# Patient Record
Sex: Female | Born: 1940 | Race: White | Hispanic: No | Marital: Married | Smoking: Former smoker
Health system: Southern US, Community
[De-identification: ages and names within clinical notes are randomized; demographics above are authoritative.]

## PROBLEM LIST (undated history)

## (undated) DIAGNOSIS — L409 Psoriasis, unspecified: Secondary | ICD-10-CM

## (undated) DIAGNOSIS — K635 Polyp of colon: Secondary | ICD-10-CM

## (undated) DIAGNOSIS — L0390 Cellulitis, unspecified: Secondary | ICD-10-CM

## (undated) DIAGNOSIS — E785 Hyperlipidemia, unspecified: Secondary | ICD-10-CM

## (undated) DIAGNOSIS — H269 Unspecified cataract: Secondary | ICD-10-CM

## (undated) DIAGNOSIS — K2270 Barrett's esophagus without dysplasia: Secondary | ICD-10-CM

## (undated) DIAGNOSIS — E669 Obesity, unspecified: Secondary | ICD-10-CM

## (undated) DIAGNOSIS — F419 Anxiety disorder, unspecified: Secondary | ICD-10-CM

## (undated) DIAGNOSIS — C50919 Malignant neoplasm of unspecified site of unspecified female breast: Secondary | ICD-10-CM

## (undated) DIAGNOSIS — K5790 Diverticulosis of intestine, part unspecified, without perforation or abscess without bleeding: Secondary | ICD-10-CM

## (undated) DIAGNOSIS — J189 Pneumonia, unspecified organism: Secondary | ICD-10-CM

## (undated) DIAGNOSIS — D219 Benign neoplasm of connective and other soft tissue, unspecified: Secondary | ICD-10-CM

## (undated) DIAGNOSIS — E0500 Thyrotoxicosis with diffuse goiter without thyrotoxic crisis or storm: Secondary | ICD-10-CM

## (undated) DIAGNOSIS — M797 Fibromyalgia: Secondary | ICD-10-CM

## (undated) HISTORY — PX: MASTECTOMY: SHX3

## (undated) HISTORY — PX: APPENDECTOMY: SHX54

## (undated) HISTORY — DX: Pneumonia, unspecified organism: J18.9

## (undated) HISTORY — DX: Psoriasis, unspecified: L40.9

## (undated) HISTORY — PX: BREAST SURGERY: SHX581

## (undated) HISTORY — DX: Hyperlipidemia, unspecified: E78.5

## (undated) HISTORY — DX: Thyrotoxicosis with diffuse goiter without thyrotoxic crisis or storm: E05.00

## (undated) HISTORY — DX: Diverticulosis of intestine, part unspecified, without perforation or abscess without bleeding: K57.90

## (undated) HISTORY — DX: Benign neoplasm of connective and other soft tissue, unspecified: D21.9

## (undated) HISTORY — DX: Barrett's esophagus without dysplasia: K22.70

## (undated) HISTORY — DX: Polyp of colon: K63.5

## (undated) HISTORY — DX: Fibromyalgia: M79.7

## (undated) HISTORY — PX: THYROIDECTOMY: SHX17

## (undated) HISTORY — DX: Anxiety disorder, unspecified: F41.9

## (undated) HISTORY — DX: Cellulitis, unspecified: L03.90

## (undated) HISTORY — DX: Malignant neoplasm of unspecified site of unspecified female breast: C50.919

## (undated) HISTORY — DX: Obesity, unspecified: E66.9

---

## 1997-08-19 ENCOUNTER — Ambulatory Visit (HOSPITAL_COMMUNITY): Admission: RE | Admit: 1997-08-19 | Discharge: 1997-08-19 | Payer: Self-pay | Admitting: Obstetrics and Gynecology

## 1998-04-25 ENCOUNTER — Encounter: Payer: Self-pay | Admitting: Endocrinology

## 1998-04-25 ENCOUNTER — Ambulatory Visit (HOSPITAL_COMMUNITY): Admission: RE | Admit: 1998-04-25 | Discharge: 1998-04-25 | Payer: Self-pay | Admitting: Endocrinology

## 1998-05-15 ENCOUNTER — Ambulatory Visit: Admission: RE | Admit: 1998-05-15 | Discharge: 1998-05-15 | Payer: Self-pay | Admitting: Cardiology

## 1998-06-15 ENCOUNTER — Emergency Department (HOSPITAL_COMMUNITY): Admission: EM | Admit: 1998-06-15 | Discharge: 1998-06-15 | Payer: Self-pay | Admitting: Emergency Medicine

## 1998-06-16 ENCOUNTER — Encounter: Payer: Self-pay | Admitting: Emergency Medicine

## 1999-02-26 ENCOUNTER — Other Ambulatory Visit: Admission: RE | Admit: 1999-02-26 | Discharge: 1999-02-26 | Payer: Self-pay | Admitting: Obstetrics and Gynecology

## 1999-07-31 ENCOUNTER — Encounter: Payer: Self-pay | Admitting: Endocrinology

## 1999-07-31 ENCOUNTER — Ambulatory Visit (HOSPITAL_COMMUNITY): Admission: RE | Admit: 1999-07-31 | Discharge: 1999-07-31 | Payer: Self-pay | Admitting: Endocrinology

## 1999-08-20 ENCOUNTER — Ambulatory Visit (HOSPITAL_COMMUNITY): Admission: RE | Admit: 1999-08-20 | Discharge: 1999-08-20 | Payer: Self-pay | Admitting: Endocrinology

## 1999-08-20 ENCOUNTER — Encounter: Payer: Self-pay | Admitting: Endocrinology

## 2000-04-13 ENCOUNTER — Encounter: Admission: RE | Admit: 2000-04-13 | Discharge: 2000-06-03 | Payer: Self-pay | Admitting: Surgery

## 2001-06-18 ENCOUNTER — Other Ambulatory Visit: Admission: RE | Admit: 2001-06-18 | Discharge: 2001-06-18 | Payer: Self-pay | Admitting: Obstetrics and Gynecology

## 2001-07-20 ENCOUNTER — Encounter (INDEPENDENT_AMBULATORY_CARE_PROVIDER_SITE_OTHER): Payer: Self-pay | Admitting: Specialist

## 2001-07-20 ENCOUNTER — Ambulatory Visit (HOSPITAL_COMMUNITY): Admission: RE | Admit: 2001-07-20 | Discharge: 2001-07-20 | Payer: Self-pay | Admitting: *Deleted

## 2002-09-14 ENCOUNTER — Other Ambulatory Visit: Admission: RE | Admit: 2002-09-14 | Discharge: 2002-09-14 | Payer: Self-pay | Admitting: Obstetrics and Gynecology

## 2003-01-08 ENCOUNTER — Emergency Department (HOSPITAL_COMMUNITY): Admission: EM | Admit: 2003-01-08 | Discharge: 2003-01-08 | Payer: Self-pay | Admitting: Emergency Medicine

## 2003-01-08 ENCOUNTER — Encounter: Payer: Self-pay | Admitting: Emergency Medicine

## 2003-03-27 ENCOUNTER — Encounter (HOSPITAL_COMMUNITY): Payer: Self-pay | Admitting: Oncology

## 2003-03-27 ENCOUNTER — Ambulatory Visit (HOSPITAL_COMMUNITY): Admission: RE | Admit: 2003-03-27 | Discharge: 2003-03-27 | Payer: Self-pay | Admitting: Oncology

## 2003-09-18 ENCOUNTER — Other Ambulatory Visit: Admission: RE | Admit: 2003-09-18 | Discharge: 2003-09-18 | Payer: Self-pay | Admitting: Obstetrics and Gynecology

## 2003-11-05 ENCOUNTER — Emergency Department (HOSPITAL_COMMUNITY): Admission: EM | Admit: 2003-11-05 | Discharge: 2003-11-05 | Payer: Self-pay | Admitting: Emergency Medicine

## 2003-11-20 ENCOUNTER — Ambulatory Visit (HOSPITAL_COMMUNITY): Admission: RE | Admit: 2003-11-20 | Discharge: 2003-11-20 | Payer: Self-pay | Admitting: *Deleted

## 2003-11-20 ENCOUNTER — Encounter (INDEPENDENT_AMBULATORY_CARE_PROVIDER_SITE_OTHER): Payer: Self-pay | Admitting: Specialist

## 2004-09-27 ENCOUNTER — Encounter: Admission: RE | Admit: 2004-09-27 | Discharge: 2004-09-27 | Payer: Self-pay | Admitting: Endocrinology

## 2004-09-30 ENCOUNTER — Other Ambulatory Visit: Admission: RE | Admit: 2004-09-30 | Discharge: 2004-09-30 | Payer: Self-pay | Admitting: Obstetrics and Gynecology

## 2005-03-24 ENCOUNTER — Ambulatory Visit: Payer: Self-pay | Admitting: Oncology

## 2006-03-20 ENCOUNTER — Ambulatory Visit: Payer: Self-pay | Admitting: Oncology

## 2006-03-24 LAB — COMPREHENSIVE METABOLIC PANEL
Albumin: 4.1 g/dL (ref 3.5–5.2)
Alkaline Phosphatase: 57 U/L (ref 39–117)
Calcium: 9.4 mg/dL (ref 8.4–10.5)
Chloride: 102 mEq/L (ref 96–112)
Potassium: 4.2 mEq/L (ref 3.5–5.3)
Sodium: 137 mEq/L (ref 135–145)
Total Bilirubin: 0.5 mg/dL (ref 0.3–1.2)

## 2006-03-24 LAB — CBC WITH DIFFERENTIAL/PLATELET
Eosinophils Absolute: 0.1 10*3/uL (ref 0.0–0.5)
HCT: 38.5 % (ref 34.8–46.6)
HGB: 13.3 g/dL (ref 11.6–15.9)
LYMPH%: 24.8 % (ref 14.0–48.0)
MCH: 31.1 pg (ref 26.0–34.0)
MCV: 89.9 fL (ref 81.0–101.0)
MONO#: 0.5 10*3/uL (ref 0.1–0.9)
MONO%: 6.7 % (ref 0.0–13.0)
NEUT%: 66.7 % (ref 39.6–76.8)
Platelets: 227 10*3/uL (ref 145–400)

## 2006-03-24 LAB — LACTATE DEHYDROGENASE: LDH: 147 U/L (ref 94–250)

## 2007-03-19 ENCOUNTER — Ambulatory Visit: Payer: Self-pay | Admitting: Oncology

## 2007-06-10 HISTORY — PX: ROTATOR CUFF REPAIR: SHX139

## 2008-05-28 ENCOUNTER — Inpatient Hospital Stay (HOSPITAL_COMMUNITY): Admission: EM | Admit: 2008-05-28 | Discharge: 2008-05-31 | Payer: Self-pay | Admitting: Family Medicine

## 2008-05-30 ENCOUNTER — Ambulatory Visit: Payer: Self-pay | Admitting: Vascular Surgery

## 2008-05-30 ENCOUNTER — Encounter (INDEPENDENT_AMBULATORY_CARE_PROVIDER_SITE_OTHER): Payer: Self-pay | Admitting: Internal Medicine

## 2008-09-07 ENCOUNTER — Ambulatory Visit (HOSPITAL_BASED_OUTPATIENT_CLINIC_OR_DEPARTMENT_OTHER): Admission: RE | Admit: 2008-09-07 | Discharge: 2008-09-08 | Payer: Self-pay | Admitting: Orthopedic Surgery

## 2009-12-24 ENCOUNTER — Encounter: Admission: RE | Admit: 2009-12-24 | Discharge: 2009-12-24 | Payer: Self-pay | Admitting: Endocrinology

## 2010-09-06 ENCOUNTER — Encounter (INDEPENDENT_AMBULATORY_CARE_PROVIDER_SITE_OTHER): Payer: Self-pay | Admitting: *Deleted

## 2010-09-18 LAB — POCT HEMOGLOBIN-HEMACUE: Hemoglobin: 13.6 g/dL (ref 12.0–15.0)

## 2010-09-19 LAB — BASIC METABOLIC PANEL
Calcium: 9.2 mg/dL (ref 8.4–10.5)
Creatinine, Ser: 1.03 mg/dL (ref 0.4–1.2)
GFR calc non Af Amer: 54 mL/min — ABNORMAL LOW (ref >60)
Glucose, Bld: 109 mg/dL — ABNORMAL HIGH (ref 70–99)
Potassium: 4.8 mEq/L (ref 3.5–5.1)
Sodium: 139 mEq/L (ref 135–145)

## 2010-10-15 ENCOUNTER — Ambulatory Visit: Payer: Self-pay | Admitting: Gastroenterology

## 2010-10-22 NOTE — H&P (Signed)
NAME:  Kathy Cooper, Kathy Cooper NO.:  [id number]   MEDICAL RECORD NO.:  [id number]          PATIENT TYPE:  EMS   LOCATION:  MAJO                         FACILITY:  [facility]   PHYSICIAN:  Eduard Clos, MDDATE OF BIRTH:  1941/03/25   DATE OF ADMISSION:  05/28/2008  DATE OF DISCHARGE:                              HISTORY & PHYSICAL   PRIMARY CARE PHYSICIAN:  Brooke Bonito, M.D.   CHIEF COMPLAINT:  Right upper extremity erythema and pain.   HISTORY OF PRESENT ILLNESS:  A 70 year old female with history of right-  sided CA of breast treated with radical mastectomy, history of  hyperlipidemia, depression, Grave's disease, status post radiation with  Synthroid now.  The patient has been complaining of increasing erythema  and pain in the right upper extremity.  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  Started developing some erythema in the morning  around her right wrist which gradually worsened over the evening.  She  did call her family physician who called in some antibiotics, but the  patient came to the ER as things got worse and the patient was found to  have erythema extending from her wrist to her shoulder area of the right  upper extremity and is being admitted for further management of her  cellulitis.  The patient denies any fever but did have some chills-like  feeling.  She denies any chest pain, shortness of breath, nausea,  vomiting, diarrhea, loss of function or weakness of limbs, headache,  dysuria,  or discharge .   PAST MEDICAL HISTORY:  1. History of carcinoma of the breast, status post radical mastectomy      on the right side.  2. Hyperlipidemia.  3. Hypothyroidism, status post Grave's disease with.  4. History of depression.   PAST SURGICAL HISTORY:  Right-sided radical mastectomy for carcinoma of  the breast.   MEDICATIONS PRIOR TO ADMISSION:  1,  The patient  is on phentermine 37.5  mg p.o. daily.  1. Zolpidem 5 mg p.o. at bedtime.  2. Omeprazole 20 mg p.o. daily.  3. Crestor 10 mg daily.  4. Citalopram 20 mg daily.  5. Synthroid 88 mcg p.o. daily.  6. Ventolin two puffs q.6 h. p.r.n.   ALLERGIES:  No known drug allergies.   FAMILY HISTORY:  Noncontributory.   SOCIAL HISTORY:  The patient lives with her husband.  Denies smoking  cigarettes or drinking alcohol  or using illegal drugs.   REVIEW OF SYSTEMS:  As per history of presenting illness, nothing else  significant.   PHYSICAL EXAMINATION:  GENERAL:  The patient was examined at the  bedside.  Not in acute distress.  VITAL SIGNS:  Blood pressure 148/55, pulse 80 per minute, temperature  98.3, respiration 18 per minute, oxygen saturation 97%.  HEENT:  Anicteric.  No pallor.  LUNGS:  Bilateral air entry.  No rhonchi on auscultation.  HEART:  S1 and S2 heard.  ABDOMEN:  Soft, nontender.  Bowel sounds heard.  No guarding, no  rigidity.  CNS:  Alert, awake, oriented to time, place and person.  Motor strength  in the lower extremities 5/5.  EXTREMITIES:  There is and mild induration extending from the right hand  to her right shoulder.  There is no compromise in her joint motion and  there is no joint swelling.  The rest of the extremities, pulses felt  and no edema.  Pulses in the right upper extremity are bounding.   LABORATORY DATA:  WBC 13.1, hemoglobin 13.8, hematocrit 41.2, platelets  236,000, neutrophils 84%.  Basic metabolic panel showed sodium 136,  potassium 3.9, chloride 103, CO2 26, glucose 110, BUN 7, creatinine 0.8,  calcium 9.1.   ASSESSMENT:  1. Cellulitis of the right upper extremity.  2. History of carcinoma of the breast, status post right radical      mastectomy.  3. Hyperlipidemia.  4. Hypothyroidism.  5. History of depression with no suicidal ideation now.   PLAN:  Admit the patient to the medical floor.  Will start the patient  on vancomycin and Zosyn,  keep right arm elevated.  Will get cultures and  follow them.  Continue home medications.  The patient at this time  wanted to continue her phentermine, hold off that.  Further  recommendations as conditions revolves.      Eduard Clos, MD  Electronically Signed     ANK/MEDQ  D:  05/28/2008  T:  05/29/2008  Job:  [id number]

## 2010-10-22 NOTE — Op Note (Signed)
NAME:  Kathy Cooper, Kathy Cooper NO.:  [id number]   MEDICAL RECORD NO.:  [id number]          PATIENT TYPE:  AMB   LOCATION:  DSC                          FACILITY:  [facility]   PHYSICIAN:  Katy Fitch. Sypher, M.D. DATE OF BIRTH:  Jan 11, 1941   DATE OF PROCEDURE:  09/07/2008  DATE OF DISCHARGE:                               OPERATIVE REPORT   PREOPERATIVE DIAGNOSES:  1. 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  2. 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  3. Biceps tendinopathy.   POSTOPERATIVE DIAGNOSES:  1. 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  2. 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  3. Biceps tendinopathy.  4. Labral degenerative tear.   OPERATIONS:  1. Diagnostic arthroscopy, right glenohumeral joint identifying a 25%      biceps degenerative tear.  Also, a 90% deep surface partial-      thickness articular-surface tendon avulsion degenerative tear of      the supraspinatus, infraspinatus, and rotator cuff tendons.  2. Arthroscopic subacromial decompression including acromioplasty,      partial relaxation of coracoacromial ligament.  3. Arthroscopic distal clavicle resection.  4. Open repair of a chronic supraspinatus, infraspinatus rotator cuff      tear utilizing double row technique with a PEEK corkscrews medially      and swivel locks laterally.   OPERATING SURGEON:  Katy Fitch. Sypher, MD   ASSISTANT:  Marveen Reeks Dasnoit, PA-C   ANESTHESIA:  General endotracheal supplemented by right interscalene  block.   SUPERVISING ANESTHESIOLOGIST:  Quita Skye. Krista Blue, MD   INDICATIONS:  Kathy Cooper is a well-known 70 year old woman referred  through the courtesy of Dr. Adela Lank.  She has a history of chronic  right shoulder pain.  Clinical examination revealed evidence of a  probable rotator cuff tear.  Plain films  documented AC degenerative  arthritis.  Due to her pain, weakness of abduction, and positive  impingement signs, an MRI was obtained which confirmed a degenerative  tear of the supraspinatus, infraspinatus rotator cuff tendons as well as  very unfavorable AC anatomy.  She had abnormal signal in biceps and  labrum.   We recommended diagnostic arthroscopy and anticipated open repair of the  rotator cuff due to her history of mastectomy and lymph node resection  and issues with chronic right arm lymphedema.  Our plan was to quickly  perform arthroscopic evaluation of the shoulder followed by arthroscopic  subacromial decompression and distal clavicle resection but avoid the  possible clysis that would occur with prolonged use of an arthroscopic  pump for repair.   Preoperatively, questions were invited and answered in detail.  She was  advised of the potential risks and benefits of this procedure.   PROCEDURE IN DETAIL:  Darlyn Chamber was brought to the operating room  and placed in supine position on the operating table.   Following an anesthesia consult with Dr. Krista Blue, a right interscalene  block was placed in the holding area without complication.  She was brought to room 1 of the [facility], placed in supine  position on the operating table and under Dr. Robina Ade direct  supervision, general anesthesia by endotracheal technique induced.   Ms. Herald was carefully positioned in a beach-chair position with aid  of a torso and head holder designed for shoulder arthroscopy.  Passive  leg compression pumps were applied for deep vein thrombosis prevention.   The entire right extremity and forequarter prepped with DuraPrep and  draped with impervious arthroscopy drapes.   Procedure commenced with placement of the arthroscope through a standard  posterior viewing portal with a switching stick brought in anteriorly.  Diagnostic arthroscopy confirmed a degenerative labrum.   There was no  sign of significant arthritis of the hyaline articular cartilage  surfaces of the glenoid and humeral head.  The biceps had a 25%  degenerative tear just proximal to the intertubercular groove.  The deep  surface of the rotator cuff was very degenerative with a PASTA lesion  measuring at least 90% the thickness of the insertion of the  supraspinatus and about 75% the thickness of the infraspinatus  insertion.   The biceps, labrum, and deep surface of the rotator cuff were debrided  with a 4.5-mm suction shaver brought into the anterior portal under  direct vision.  After debridement was completed, hemostasis was achieved  followed by removal of the scope from glenohumeral joint and placement  of the scope in the subacromial space.  There was a florid bursitis  noted.  A meticulous bursectomy allowed examination of the anatomy of  the coracoacromial arch.  The capsule of the [facility] joint was taken down  with the cutting cautery and a very degenerative distal clavicle with an  inferior projecting osteophyte was documented with a digital camera.  The acromion was leveled to a type 1 morphology and the coracoacromial  ligament partially released to allow leveling of the acromion medially.   The distal clavicle was resected 15 mm with a suction bur followed by  hemostasis.  After debridement of the bursa, we proceeded directly to  open repair of the cuff to a muscle-splitting incision between the  anterior middle thirds of the deltoid.  Due to Ms. Ditullio mesomorphic  status, a 5-cm incision was required to expose the deltoid.  The deltoid  was split and the tear identified.  The greater tuberosity was lowered  approximately 3 mm in profile with a bur followed by placement of two  medial PEEK corkscrew anchors with a FiberWire.  A total of four medial  mattress sutures were placed followed by use of two lateral push locks  for an over-the-top double row technique.   Anatomic  footprint was achieved with a very low profile of the  tuberosity created after tuberosity plasty.   The wound was then lavaged with sterile saline followed by repair of the  deltoid with simple suture of 0-Vicryl and a proximal apical corner  suture of #2 FiberWire.   The skin incision was repaired with subcutaneous suture of 0 and 2-0  Vicryl followed by intradermal 3-0 Prolene with Steri-Strips.   Due to the fact that Ms. Pouliot had a history of a MRSA infection in  the early winter, she was provided an additional gram of vancomycin over  90 minutes as a prophylactic antibiotic.   She will be admitted to the recovery care center for observation of  vital signs, appropriate pain management in the form of p.o. and IV  Dilaudid,  and possible use of PCA morphine.   She will be discharged in the morning of September 07, 2008 with  prescriptions for Dilaudid 2 mg 1-2 tablets p.o. q.4-6 hours p.r.n.  pain, Motrin 600 mg 1 p.o. q.6 h. p.r.n. pain, and Keflex 500 mg 1 p.o.  q.8 h. x4 days.      Katy Fitch Sypher, M.D.  Electronically Signed     RVS/MEDQ  D:  09/07/2008  T:  09/08/2008  Job:  [id number]

## 2010-10-22 NOTE — Discharge Summary (Signed)
NAME:  Kathy Cooper, Kathy Cooper NO.:  [id number]   MEDICAL RECORD NO.:  [id number]          PATIENT TYPE:  INP   LOCATION:  5158                         FACILITY:  [facility]   PHYSICIAN:  Beckey Rutter, MD  DATE OF BIRTH:  [birth year]   DATE OF ADMISSION:  05/28/2008  DATE OF DISCHARGE:  05/31/2008                               DISCHARGE SUMMARY   PRIMARY CARE PHYSICIAN:  Brooke Bonito, MD   CHIEF COMPLAINT:  Right upper extremity erythema and pain.   HISTORY OF PRESENT ILLNESS:  A 70 year old admitted with above complaint  for intravenous antibiotic.  Please refer to the full H&P dictated on  the day of admission by Dr. Midge Minium.   HOSPITAL COURSE:  During hospital course, the patient was treated with  vancomycin and Zosyn.  The patient has very good response to the  antibiotic.  Today, the patient's erythema is totally cleared.  The  swelling is very much decreased to the premorbid baseline.  The patient  is stable for discharge today.   DISCHARGE MEDICATIONS:  1. Keflex 500 mg p.o. b.i.d. for seven more days.  2. Ambien 5 mg p.o. at bedtime.  3. Omeprazole 20 mg p.o. daily.  4. Crestor 10 mg daily.  5. Citalopram 20 mg daily.  6. Synthroid 88 mcg daily.  7. Ventolin 2 puffs q.6 h. p.r.n.   DISCHARGE DIAGNOSES:  1. Right upper arm cellulitis.  2. Right upper arm lymphedema, status post radical mastectomy in the      right side.  3. History of carcinoma of the breast, status post radical mastectomy.  4. Hyperlipidemia.  5. Hypothyroidism.  6. History of depression.   LABORATORY DATA:  White blood count on the day of admission is 13.1.  The last white blood count was done yesterday, May 30, 2008, and  white count is 4.2.  Hemoglobin is 12.6 and platelet is 178.  Blood  culture is negative up-to-date.  Hemoglobin A1c is 5.6.  TSH is 0.613.  Sodium 136, potassium 3.9, chloride 103, bicarb 26, glucose 110, BUN 7,  and creatinine 0.87.   DISCHARGE/PLAN:  The patient is discharged today on antibiotic.  The  patient received vancomycin, I suspect to cover for MRSA.  Nevertheless,  the patient improved very quickly in the span of 48 hours.  The patient  also has recurrent history of cellulitis.  She is treated with  penicillin with success all the time.  The patient never diagnosed with  methicillin-resistant Staph aureus before.  With all those, I would  conclude the patient did not have MRSA and  I will continue with antibiotic.  I discussed the condition thoroughly  with the patient.  I answered all her questions.  I also instructed the  patient to come to the Emergency Department immediately should the  redness of the arm returns.  She is aware and agreeable to discharge  plan.      Beckey Rutter, MD  Electronically Signed     EME/MEDQ  D:  05/31/2008  T:  05/31/2008  Job:  [id number]   cc:  Brooke Bonito, M.D.

## 2010-10-25 NOTE — Op Note (Signed)
NAME:  TAYLR, MEUTH NO.:  [id number]   MEDICAL RECORD NO.:  [id number]                   PATIENT TYPE:  AMB   LOCATION:  ENDO                                 FACILITY:  [facility]   PHYSICIAN:  Georgiana Spinner, M.D.                 DATE OF BIRTH:  [birth year]   DATE OF PROCEDURE:  DATE OF DISCHARGE:                                 OPERATIVE REPORT   PROCEDURE:  Upper endoscopy with biopsy.   INDICATIONS FOR PROCEDURE:  Gastroesophageal reflux disease with Barrett's.   ANESTHESIA:  Demerol 50, Versed 6.   DESCRIPTION OF PROCEDURE:  With the patient mildly sedated in the left  lateral decubitus position, the Olympus videoscopic endoscope was inserted  in the mouth and passed under direct vision through the esophagus which  appeared normal until we reached the distal esophagus and there was a  question of Barrett's photographed and biopsied. We entered into the  stomach, fundus, body, antrum, duodenal bulb and second portion of the  duodenum and all appeared normal. From this point, the endoscope was slowly  withdrawn taking circumferential views of the duodenal mucosa until the  endoscope was then pulled back in the stomach, placed in retroflexion to  view the stomach from below. The endoscope was then straightened and  withdrawn taking circumferential views of the remaining gastric and  esophageal mucosa. The patient's vital signs and pulse oximeter remained  stable. The patient tolerated the procedure well without apparent  complications.   FINDINGS:  Barrett's esophagus biopsied, await biopsy report. The patient  will call me for results and followup with me as an outpatient.                                               Georgiana Spinner, M.D.    GMO/MEDQ  D:  11/20/2003  T:  11/20/2003  Job:  [id number]

## 2010-10-25 NOTE — Op Note (Signed)
[place]  Patient:    Kathy Cooper, Kathy Cooper Visit Number: [id number] MRN: [id number]          Service Type: END Location: ENDO Attending Physician:  Sabino Gasser Dictated by:   Sabino Gasser, M.D. Proc. Date: 07/20/01 Admit Date:  07/20/2001   CC:         Trevor Iha, M.D.   Operative Report  PROCEDURE:  Upper endoscopy.  ANESTHESIA:  Demerol 90 and Versed 9 mg.  INDICATIONS:  Hemoccult positivity with reflux symptomatology.  DESCRIPTION OF PROCEDURE:  With the patient mildly sedated in the left lateral decubitus position, the Olympus videoscopic endoscope was inserted into the mouth, passed under direct vision through the esophagus.  The distal esophagus was approached and showed a hiatal hernia with changes of esophagitis and/or Barretts esophagus seen and photographed.  We entered into the stomach through the hiatal hernia.  Fundus, body, antrum, duodenal bulb, second portion of the duodenum were well visualized.  Photographs were taken from this point.  The endoscope was slowly withdrawn taking circumferential views of the entire duodenal mucosa.  The endoscope was then pulled back into the stomach and placed in retroflexion to view the stomach from below.  The endoscope was then straightened and withdrawn taking circumferential views of the remaining gastric and esophageal mucosa stopping in the body and antrum of the stomach where diffuse erythema with some flecks of blood were seen in the stomach.  These were photographed and multiple biopsies were taken.  The endoscope was withdrawn to the esophagus.  Distal esophagus showed changes of esophagus and/or Barretts esophagus, photographed and biopsied.  The endoscope was then withdrawn taking circumferential views of the remaining esophageal mucosa.  The patients vital signs and pulse oximetry remained stable. The patient tolerated the procedure well without  apparent complications.  FINDINGS:  Changes of gastritis in antrum and body of stomach.  Await biopsy report.  Changes of esophagitis and/or Barretts esophagus.  Again, await biopsy report.  The patient will call me for results and follow up with me as an outpatient.  Proceed to colonoscopy as planned. Dictated by:   Sabino Gasser, M.D. Attending Physician:  Sabino Gasser DD:  07/20/01 TD:  07/20/01 Job: [id number]

## 2010-10-25 NOTE — Op Note (Signed)
[place]  Patient:    Kathy Cooper, Kathy Cooper Visit Number: [id number] MRN: [id number]          Service Type: END Location: ENDO Attending Physician:  Sabino Gasser Dictated by:   Sabino Gasser, M.D. Proc. Date: 07/20/01 Admit Date:  07/20/2001   CC:         Trevor Iha, M.D.   Operative Report  PROCEDURE:  Colonoscopy.  INDICATIONS:  Hemoccult positivity.  ANESTHESIA:  Demerol 30, Versed 3 mg additionally.  DESCRIPTION OF PROCEDURE:  With the patient mildly sedated in the left lateral decubitus position, the Olympus videoscopic colonoscope was inserted in the rectum after normal rectal examination and passed under direct vision to the cecum, identified by the ileocecal valve and appendiceal orifice, both of which were photographed.  From this point, the colonoscope was slowly withdrawn taking circumferential views of the entire colonic mucosa stopping only in the rectum which appeared normal and this showed hemorrhoids on retroflexed view.  The endoscope was straightened and withdrawn.  The patients vital signs and pulse oximetry remained stable.  The patient tolerated the procedure well without apparent complications.  FINDINGS:  Internal hemorrhoids, otherwise unremarkable examination.  PLAN:  See endoscopy note for further details. Dictated by:   Sabino Gasser, M.D. Attending Physician:  Sabino Gasser DD:  07/20/01 TD:  07/20/01 Job: [id number]

## 2010-11-26 ENCOUNTER — Encounter: Payer: Self-pay | Admitting: Gastroenterology

## 2010-11-26 ENCOUNTER — Ambulatory Visit (INDEPENDENT_AMBULATORY_CARE_PROVIDER_SITE_OTHER): Payer: Medicare Other | Admitting: Gastroenterology

## 2010-11-26 DIAGNOSIS — K2270 Barrett's esophagus without dysplasia: Secondary | ICD-10-CM

## 2010-11-26 DIAGNOSIS — Z1211 Encounter for screening for malignant neoplasm of colon: Secondary | ICD-10-CM

## 2010-11-26 DIAGNOSIS — R1310 Dysphagia, unspecified: Secondary | ICD-10-CM

## 2010-11-26 DIAGNOSIS — K219 Gastro-esophageal reflux disease without esophagitis: Secondary | ICD-10-CM

## 2010-11-26 MED ORDER — PEG-KCL-NACL-NASULF-NA ASC-C 100 G PO SOLR: 1.0000 | ORAL | Status: DC

## 2010-11-26 MED ORDER — ESOMEPRAZOLE MAGNESIUM 40 MG PO PACK: 40.0000 mg | PACK | Freq: Every day | ORAL | Status: DC

## 2010-11-26 NOTE — Progress Notes (Signed)
HPI: This is a  Very pleasant 70 yo woman  She has been having a lot of acid reflux, burning up into her mouth even. Shes had epigastric pains, burning. Stomach aches.  Intermittent loose stools.  Lots of indigestion and flatus as well.  These have all been going on for many years.  Certain foods can exacerbate the symptoms (key lime pie recently).  The symptoms seem to improve with avoiding "acidy foods,"  She takes omeprazole once daily, sometimes twice a day.  She has intermittent dysphagia.  She has coughing/congestion (PCP tells her it may be from GERD).    She was told she had Barrett's esophagus (had EGD in 2005, but none since then)  She had a polyp removed in 2005 (does not recall being told she needed a repeat anytime.  I reviewed records from Dr. Virginia Rochester she had a colonoscopy in 2003, no polyps were found.  EGD in 2002 and also in 2005 both are found in intestinal metaplasia without dysplasia. She has not had repeat EGD since then.   Overa stable weight. No overt GI bleeding   Re and , view of systems: Pertinent positive and negative review of systems were noted in the above HPI section.  All other review of systems was otherwise negative.   Past Medical History  Diagnosis Date  . Anxiety   . Breast cancer   . Psoriasis   . Fibroids   . Asthma   . Barrett's esophagus   . Colon polyps   . Diverticulosis   . Fibromyalgia   . Hyperlipemia   . Grave's disease   . Obesity   . Pneumonia     Past Surgical History  Procedure Date  . Thyroidectomy   . Mastectomy   . Appendectomy      reports that she has quit smoking. She has never used smokeless tobacco. She reports that she does not drink alcohol or use illicit drugs.  family history includes Breast cancer in an unspecified family member; Diabetes in an unspecified family member; Heart disease in an unspecified family member; and Kidney disease in her brother.    Current Medications, Allergies were all reviewed with  the patient via [facility] electronic medical record system.    Physical Exam: BP 118/82  Pulse 80  Ht 5\' 1"  (1.549 m)  Wt 199 lb (90.266 kg)  BMI 37.60 kg/m2 Constitutional: generally well-appearing Psychiatric: alert and oriented x3 Eyes: extraocular movements intact Mouth: oral pharynx moist, no lesions Neck: supple no lymphadenopathy Cardiovascular: heart regular rate and rhythm Lungs: clear to auscultation bilaterally Abdomen: soft, nontender, nondistended, no obvious ascites, no peritoneal signs, normal bowel sounds Extremities: no lower extremity edema bilaterally Skin: no lesions on visible extremities    Assessment and plan: 70 y.o. female with personal history of barretts, routine risk for colon cancer  EGd and colonoscopy.  Samples of nexium given, GERD handout given.

## 2010-11-26 NOTE — Patient Instructions (Addendum)
  You will be set up for an upper endoscopy for dysphagia, GERD. You will be set up for a colonoscopy for routine screening. Samples of nexium given, use this once daily 20-30 min before breakfast. GERD handout given.  Acid Reflux (GERD) Acid reflux is also called gastroesophageal reflux disease (GERD). Your stomach makes acid to help digest food. Acid reflux happens when acid from your stomach goes into the tube between your mouth and stomach (esophagus). Your stomach is protected from the acid, but this tube is not. When acid gets into the tube, it may cause a burning feeling in the chest (heartburn). Besides heartburn, other health problems can happen if the acid keeps going into the tube. Some causes of acid reflux include:  Being overweight.   Smoking.   Drinking alcohol.   Eating large meals.   Eating meals and then going to bed right away.   Eating certain foods.   Increased stomach acid production.  HOME CARE  Take all medicine as told by your doctor.   You may need to:   Lose weight.   Avoid alcohol.   Quit smoking.   Do not eat big meals. It is better to eat smaller meals throughout the day.   Do not eat a meal and then nap or go to bed.   Sleep with your head higher than your stomach.   Avoid foods that bother you.   You may need more tests, or you may need to see a special doctor.  GET HELP RIGHT AWAY IF:  You have chest pain that is different than before.   You have pain that goes to your arms, jaw, or between your shoulder blades.   You throw up (vomit) blood, dark brown liquid, or your throw up looks like coffee grounds.   You have trouble swallowing.   You have trouble breathing or cannot stop coughing.   You feel dizzy or pass out.   Your skin is cool, wet, and pale.   Your medicine is not helping.  MAKE SURE YOU:   Understand these instructions.   Will watch your condition.   Will get help right away if you are not doing well or get  worse.  Document Released: 11/12/2007 Document Re-Released: 08/20/2009 [facility] Patient Information 2011 [place], [place].

## 2010-12-02 NOTE — Letter (Signed)
Summary: New Patient letter  [facility]  [address]   [place], [place] [address]   Phone: [phone]  Fax: [phone]       09/06/2010 MRN: [id number]  Kathy Cooper [address] [place], [place]  [address]  [place]  Dear Ms. [facility],  Welcome to the Gastroenterology Division at [facility].    You are scheduled to see Dr.  Christella Hartigan on 10/15/2010 at 3:30 on the 3rd floor at [facility], [address]  We ask that you try to arrive at our office 15 minutes prior to your appointment time to allow for check-in.  We would like you to complete the enclosed self-administered evaluation form prior to your visit and bring it with you on the day of your appointment.  We will review it with you.  Also, please bring a complete list of all your medications or, if you prefer, bring the medication bottles and we will list them.  Please bring your insurance card so that we may make a copy of it.  If your insurance requires a referral to see a specialist, please bring your referral form from your primary care physician.  Co-payments are due at the time of your visit and may be paid by cash, check or credit card.     Your office visit will consist of a consult with your physician (includes a physical exam), any laboratory testing he/she may order, scheduling of any necessary diagnostic testing (e.g. x-ray, ultrasound, CT-scan), and scheduling of a procedure (e.g. Endoscopy, Colonoscopy) if required.  Please allow enough time on your schedule to allow for any/all of these possibilities.    If you cannot keep your appointment, please call [phone] to cancel or reschedule prior to your appointment date.  This allows [place] the opportunity to schedule an appointment for another patient in need of care.  If you do not cancel or reschedule by 5 p.m. the business day prior to your appointment date, you will be charged a $50.00 late cancellation/no-show fee.    Thank you for  choosing [facility] for your medical needs.  We appreciate the opportunity to care for you.  Please visit [place] at our website  to learn more about our practice.                     Sincerely,                                                             The Gastroenterology Division

## 2010-12-04 ENCOUNTER — Encounter: Payer: Self-pay | Admitting: Gastroenterology

## 2010-12-04 ENCOUNTER — Ambulatory Visit (AMBULATORY_SURGERY_CENTER): Payer: Medicare Other | Admitting: Gastroenterology

## 2010-12-04 DIAGNOSIS — Z1211 Encounter for screening for malignant neoplasm of colon: Secondary | ICD-10-CM

## 2010-12-04 DIAGNOSIS — K2990 Gastroduodenitis, unspecified, without bleeding: Secondary | ICD-10-CM

## 2010-12-04 DIAGNOSIS — K2270 Barrett's esophagus without dysplasia: Secondary | ICD-10-CM

## 2010-12-04 DIAGNOSIS — K2970 Gastritis, unspecified, without bleeding: Secondary | ICD-10-CM

## 2010-12-04 DIAGNOSIS — D126 Benign neoplasm of colon, unspecified: Secondary | ICD-10-CM

## 2010-12-04 MED ORDER — SODIUM CHLORIDE 0.9 % IV SOLN: 500.0000 mL | INTRAVENOUS | Status: DC

## 2010-12-04 NOTE — Patient Instructions (Signed)
Mild gastritis Barrett's esophagus  2 small polyps removed  Await pathology for final recommendations  See green and blue sheets for additional d/c instructions.

## 2010-12-05 ENCOUNTER — Telehealth: Payer: Self-pay | Admitting: *Deleted

## 2010-12-05 NOTE — Telephone Encounter (Signed)
[id number] # gicven by pt in admitting day of proc. not in service.

## 2010-12-18 ENCOUNTER — Telehealth: Payer: Self-pay | Admitting: Gastroenterology

## 2010-12-18 NOTE — Telephone Encounter (Signed)
PT WAS READ THE LETTER THAT WAS SENT OUT AND ALSO THE REPORT AND THE PATH WAS SENT TO DR KOHUT PER THE PT REQUEST.

## 2011-03-14 ENCOUNTER — Emergency Department (HOSPITAL_BASED_OUTPATIENT_CLINIC_OR_DEPARTMENT_OTHER)
Admission: EM | Admit: 2011-03-14 | Discharge: 2011-03-14 | Disposition: A | Discharge status: Other Acute Inpatient Hospital | Payer: Medicare Other | Source: Home / Self Care | Attending: Emergency Medicine | Admitting: Emergency Medicine

## 2011-03-14 ENCOUNTER — Encounter (HOSPITAL_BASED_OUTPATIENT_CLINIC_OR_DEPARTMENT_OTHER): Payer: Self-pay | Admitting: *Deleted

## 2011-03-14 ENCOUNTER — Inpatient Hospital Stay (HOSPITAL_COMMUNITY)
Admission: AD | Admit: 2011-03-14 | Discharge: 2011-03-16 | DRG: 603 | Disposition: A | Discharge status: Home / Self Care | Payer: Medicare Other | Source: Other Acute Inpatient Hospital | Attending: Internal Medicine | Admitting: Internal Medicine

## 2011-03-14 DIAGNOSIS — M7989 Other specified soft tissue disorders: Secondary | ICD-10-CM

## 2011-03-14 DIAGNOSIS — J45909 Unspecified asthma, uncomplicated: Secondary | ICD-10-CM | POA: Insufficient documentation

## 2011-03-14 DIAGNOSIS — E669 Obesity, unspecified: Secondary | ICD-10-CM | POA: Insufficient documentation

## 2011-03-14 DIAGNOSIS — IMO0002 Reserved for concepts with insufficient information to code with codable children: Secondary | ICD-10-CM | POA: Insufficient documentation

## 2011-03-14 DIAGNOSIS — F411 Generalized anxiety disorder: Secondary | ICD-10-CM | POA: Diagnosis present

## 2011-03-14 DIAGNOSIS — Z79899 Other long term (current) drug therapy: Secondary | ICD-10-CM | POA: Insufficient documentation

## 2011-03-14 DIAGNOSIS — Z853 Personal history of malignant neoplasm of breast: Secondary | ICD-10-CM | POA: Insufficient documentation

## 2011-03-14 DIAGNOSIS — IMO0001 Reserved for inherently not codable concepts without codable children: Secondary | ICD-10-CM | POA: Insufficient documentation

## 2011-03-14 DIAGNOSIS — L0390 Cellulitis, unspecified: Secondary | ICD-10-CM

## 2011-03-14 DIAGNOSIS — J3489 Other specified disorders of nose and nasal sinuses: Secondary | ICD-10-CM | POA: Diagnosis present

## 2011-03-14 DIAGNOSIS — F329 Major depressive disorder, single episode, unspecified: Secondary | ICD-10-CM | POA: Diagnosis present

## 2011-03-14 DIAGNOSIS — F3289 Other specified depressive episodes: Secondary | ICD-10-CM | POA: Diagnosis present

## 2011-03-14 DIAGNOSIS — E785 Hyperlipidemia, unspecified: Secondary | ICD-10-CM | POA: Insufficient documentation

## 2011-03-14 DIAGNOSIS — E039 Hypothyroidism, unspecified: Secondary | ICD-10-CM | POA: Diagnosis present

## 2011-03-14 LAB — CBC
HCT: 40.9 % (ref 36.0–46.0)
HCT: 37.4 % (ref 36.0–46.0)
HCT: 41.2 % (ref 36.0–46.0)
Hemoglobin: 14 g/dL (ref 12.0–15.0)
Platelets: 201 10*3/uL (ref 150–400)
Platelets: 178 10*3/uL (ref 150–400)
Platelets: 236 10*3/uL (ref 150–400)
RBC: 4.17 MIL/uL (ref 3.87–5.11)
RDW: 14.1 % (ref 11.5–15.5)
WBC: 12.2 10*3/uL — ABNORMAL HIGH (ref 4.0–10.5)
WBC: 13.1 10*3/uL — ABNORMAL HIGH (ref 4.0–10.5)
WBC: 4.2 10*3/uL (ref 4.0–10.5)

## 2011-03-14 LAB — DIFFERENTIAL
Basophils Absolute: 0.1 10*3/uL (ref 0.0–0.1)
Basophils Relative: 0 % (ref 0–1)
Eosinophils Relative: 1 % (ref 0–5)
Eosinophils Relative: 1 % (ref 0–5)
Lymphocytes Relative: 12 % (ref 12–46)
Lymphs Abs: 1.6 10*3/uL (ref 0.7–4.0)
Monocytes Absolute: 0.6 10*3/uL (ref 0.1–1.0)
Neutrophils Relative %: 84 % — ABNORMAL HIGH (ref 43–77)

## 2011-03-14 LAB — BASIC METABOLIC PANEL
BUN: 7 mg/dL (ref 6–23)
CO2: 22 mEq/L (ref 19–32)
Calcium: 9.1 mg/dL (ref 8.4–10.5)
Creatinine, Ser: 1 mg/dL (ref 0.50–1.10)
Creatinine, Ser: 0.87 mg/dL (ref 0.4–1.2)
GFR calc non Af Amer: 57 mL/min — ABNORMAL LOW (ref >90)
GFR calc non Af Amer: >60 mL/min (ref >60)
Glucose, Bld: 118 mg/dL — ABNORMAL HIGH (ref 70–99)
Glucose, Bld: 110 mg/dL — ABNORMAL HIGH (ref 70–99)
Potassium: 4.1 mEq/L (ref 3.5–5.1)
Sodium: 137 mEq/L (ref 135–145)

## 2011-03-14 LAB — CULTURE, BLOOD (ROUTINE X 2)

## 2011-03-14 LAB — HEMOGLOBIN A1C
Hgb A1c MFr Bld: 5.7 % (ref 4.6–6.1)
Mean Plasma Glucose: 117 mg/dL

## 2011-03-14 LAB — TSH: TSH: 1.499 u[IU]/mL (ref 0.350–4.500)

## 2011-03-14 MED ORDER — CLINDAMYCIN PHOSPHATE 900 MG/50ML IV SOLN
900.0000 mg | Freq: Once | INTRAVENOUS | Status: AC
  Administered 2011-03-14: 900 mg via INTRAVENOUS
  Filled 2011-03-14: qty 50

## 2011-03-14 MED ORDER — FENTANYL CITRATE 0.05 MG/ML IJ SOLN
25.0000 ug | Freq: Once | INTRAMUSCULAR | Status: AC
  Administered 2011-03-14: 25 ug via INTRAVENOUS
  Filled 2011-03-14: qty 2

## 2011-03-14 MED ORDER — ONDANSETRON HCL 4 MG/2ML IJ SOLN
4.0000 mg | Freq: Once | INTRAMUSCULAR | Status: AC
  Administered 2011-03-14: 4 mg via INTRAVENOUS
  Filled 2011-03-14: qty 2

## 2011-03-14 NOTE — ED Notes (Signed)
Care plan and benefits of admission reviewed 

## 2011-03-14 NOTE — ED Provider Notes (Signed)
History     CSN: [id number] Arrival date & time: 03/14/2011  1:41 AM  Chief Complaint  Patient presents with  . Cellulitis    (Consider location/radiation/quality/duration/timing/severity/associated sxs/prior treatment) Patient is a 70 y.o. female presenting with rash. The history is provided by the patient.  Rash  This is a new problem.   at home tonight around 10 PM noticed redness swelling and pain to her right arm. She also notices very warm to touch. No recorded fevers or chills does have some body aches. Patient does have very dry skin on her hands with psoriasis and believes that she may have had an open wound on her hand initiated this infection. She has a history of cellulitis of her right arm about 2 years ago with prolonged hospitalization. No vomiting or diarrhea. This reports this to have spread very rapidly. Pain is sharp in nature, not radiating, moderate to severe, constant since onset, no treatment prior to arrival.  Past Medical History  Diagnosis Date  . Anxiety   . Breast cancer   . Psoriasis   . Fibroids   . Asthma   . Barrett's esophagus   . Colon polyps   . Diverticulosis   . Fibromyalgia   . Hyperlipemia   . Grave's disease   . Obesity   . Pneumonia     Past Surgical History  Procedure Date  . Thyroidectomy   . Mastectomy   . Appendectomy     Family History  Problem Relation Age of Onset  . Breast cancer    . Diabetes    . Heart disease    . Kidney disease Brother     History  Substance Use Topics  . Smoking status: Former Games developer  . Smokeless tobacco: Never Used  . Alcohol Use: No    OB History    Grav Para Term Preterm Abortions TAB SAB Ect Mult Living                  Review of Systems  Constitutional: Negative for fever and chills.  HENT: Negative for neck pain and neck stiffness.   Eyes: Negative for pain.  Respiratory: Negative for shortness of breath.   Cardiovascular: Negative for chest pain.  Gastrointestinal: Negative  for abdominal pain.  Genitourinary: Negative for dysuria.  Musculoskeletal: Negative for back pain.  Skin: Positive for rash.  Neurological: Negative for headaches.  All other systems reviewed and are negative.    Allergies  Review of patient's allergies indicates no known allergies.  Home Medications   Current Outpatient Rx  Name Route Sig Dispense Refill  . VENTOLIN HFA IN Inhalation Inhale into the lungs. 2 puffs four times a day as needed     . BETAMETHASONE VALERATE 0.1 % EX LOTN Topical Apply topically 2 (two) times daily. As needed     . CITALOPRAM HYDROBROMIDE 20 MG PO TABS Oral Take 20 mg by mouth daily.      Marland Kitchen ESOMEPRAZOLE MAGNESIUM 40 MG PO PACK Oral Take 40 mg by mouth daily before breakfast. 30 each 12  . LEVOTHYROXINE SODIUM 100 MCG PO TABS Oral Take 100 mcg by mouth daily.      Marland Kitchen OMEPRAZOLE 20 MG PO CPDR Oral Take 20 mg by mouth daily.      Marland Kitchen PEG-KCL-NACL-NASULF-NA ASC-C 100 G PO SOLR Oral Take 1 kit (100 g total) by mouth as directed. See written handout 1 kit 0  . PHENTERMINE HCL 37.5 MG PO CAPS Oral Take 37.5 mg by mouth  every morning.      Marland Kitchen ROSUVASTATIN CALCIUM 10 MG PO TABS Oral Take 10 mg by mouth daily.        BP 142/64  Pulse 94  Temp(Src) 98.3 F (36.8 C) (Oral)  Resp 18  SpO2 97%  Physical Exam  Constitutional: She is oriented to person, place, and time. She appears well-developed and well-nourished.  HENT:  Head: Normocephalic and atraumatic.  Eyes: Conjunctivae and EOM are normal. Pupils are equal, round, and reactive to light.  Neck: Trachea normal. Neck supple. No thyromegaly present.  Cardiovascular: Normal rate, regular rhythm, S1 normal, S2 normal and normal pulses.     No systolic murmur is present   No diastolic murmur is present  Pulses:      Radial pulses are 2+ on the right side, and 2+ on the left side.  Pulmonary/Chest: Effort normal and breath sounds normal. She has no wheezes. She has no rhonchi. She has no rales. She exhibits no  tenderness.  Abdominal: Soft. Normal appearance and bowel sounds are normal. There is no tenderness. There is no CVA tenderness and negative Murphy's sign.  Musculoskeletal:       BLE:s Calves nontender, no cords or erythema, negative Homans sign  Neurological: She is alert and oriented to person, place, and time. She has normal strength. No cranial nerve deficit or sensory deficit. GCS eye subscore is 4. GCS verbal subscore is 5. GCS motor subscore is 6.  Skin: Skin is warm and dry. She is not diaphoretic.       Right upper extremity: Erythema, edema, increased warmth to touch and tenderness from the wrist all weight up to and involving proximal upper arm. No skin breaks or areas of fluctuance. There is significant psoriatic changes to the hand but no obvious infected lesions. Distal neurovascular is intact  Psychiatric: Her speech is normal.       Cooperative and appropriate    ED Course  Procedures (including critical care time)  Results for orders placed during the hospital encounter of 03/14/11  CBC      Component Value Range   WBC 12.2 (*) 4.0 - 10.5 (K/uL)   RBC 4.62  3.87 - 5.11 (MIL/uL)   Hemoglobin 14.0  12.0 - 15.0 (g/dL)   HCT 16.1  09.6 - 04.5 (%)   MCV 88.5  78.0 - 100.0 (fL)   MCH 30.3  26.0 - 34.0 (pg)   MCHC 34.2  30.0 - 36.0 (g/dL)   RDW 40.9  81.1 - 91.4 (%)   Platelets 201  150 - 400 (K/uL)  DIFFERENTIAL      Component Value Range   Neutrophils Relative 81 (*) 43 - 77 (%)   Neutro Abs 10.0 (*) 1.7 - 7.7 (K/uL)   Lymphocytes Relative 13  12 - 46 (%)   Lymphs Abs 1.5  0.7 - 4.0 (K/uL)   Monocytes Relative 5  3 - 12 (%)   Monocytes Absolute 0.6  0.1 - 1.0 (K/uL)   Eosinophils Relative 1  0 - 5 (%)   Eosinophils Absolute 0.1  0.0 - 0.7 (K/uL)   Basophils Relative 0  0 - 1 (%)   Basophils Absolute 0.0  0.0 - 0.1 (K/uL)  BASIC METABOLIC PANEL      Component Value Range   Sodium 137  135 - 145 (mEq/L)   Potassium 4.1  3.5 - 5.1 (mEq/L)   Chloride 102  96 - 112  (mEq/L)   CO2 22  19 - 32 (mEq/L)  Glucose, Bld 118 (*) 70 - 99 (mg/dL)   BUN 12  6 - 23 (mg/dL)   Creatinine, Ser 1.61  0.50 - 1.10 (mg/dL)   Calcium 09.6  8.4 - 10.5 (mg/dL)   GFR calc non Af Amer 57 (*) >90 (mL/min)   GFR calc Af Amer 66 (*) >90 (mL/min)     Diagnosis: Cellulitis   MDM   Right arm infection consistent with cellulitis. IV antibiotics initiated. Labs reviewed as above. Given nature of action with rapid spreading edema erythema and warmth, medicine consultation obtained for admission. Discussed case with Dr. Conley Rolls who accepts patient in transfer to [facility]. IV pain medications provided.      Sunnie Nielsen, MD 03/14/11 [phone]

## 2011-03-14 NOTE — ED Notes (Signed)
Pt sts she noticed at apprx. 2200hrs last evening her right arm was turning red and itching. Pt sts same has now spread from her forearm up to her upper arm.

## 2011-03-15 LAB — CBC
HCT: 38.3 % (ref 36.0–46.0)
Hemoglobin: 12.8 g/dL (ref 12.0–15.0)
MCV: 91 fL (ref 78.0–100.0)
WBC: 7.2 10*3/uL (ref 4.0–10.5)

## 2011-03-15 LAB — COMPREHENSIVE METABOLIC PANEL
Alkaline Phosphatase: 52 U/L (ref 39–117)
BUN: 10 mg/dL (ref 6–23)
CO2: 26 mEq/L (ref 19–32)
Chloride: 106 mEq/L (ref 96–112)
GFR calc Af Amer: 65 mL/min — ABNORMAL LOW (ref >90)
GFR calc non Af Amer: 56 mL/min — ABNORMAL LOW (ref >90)
Glucose, Bld: 109 mg/dL — ABNORMAL HIGH (ref 70–99)
Potassium: 4.2 mEq/L (ref 3.5–5.1)
Total Bilirubin: 0.6 mg/dL (ref 0.3–1.2)

## 2011-03-15 LAB — DIFFERENTIAL
Basophils Absolute: 0 10*3/uL (ref 0.0–0.1)
Lymphocytes Relative: 22 % (ref 12–46)
Lymphs Abs: 1.6 10*3/uL (ref 0.7–4.0)
Neutro Abs: 5.1 10*3/uL (ref 1.7–7.7)

## 2011-03-15 LAB — LIPID PANEL
Cholesterol: 145 mg/dL (ref 0–200)
VLDL: 36 mg/dL (ref 0–40)

## 2011-03-15 LAB — HEMOGLOBIN A1C
Hgb A1c MFr Bld: 5.8 % — ABNORMAL HIGH (ref <5.7)
Mean Plasma Glucose: 120 mg/dL — ABNORMAL HIGH (ref <117)

## 2011-03-15 LAB — PHOSPHORUS: Phosphorus: 2.9 mg/dL (ref 2.3–4.6)

## 2011-03-18 NOTE — Discharge Summary (Signed)
  [name], Kathy NO.:  [id number]  MEDICAL RECORD NO.:  [id number]  LOCATION:  5019                         FACILITY:  [facility]  PHYSICIAN:  Kathlen Mody, MD       DATE OF BIRTH:  1941-03-27  DATE OF ADMISSION:  03/14/2011 DATE OF DISCHARGE:  03/16/2011                              DISCHARGE SUMMARY   PRIMARY CARE PHYSICIAN:  Odie Sera, DO  DISCHARGE DIAGNOSIS:  Right upper extremity cellulitis.  OTHER DIAGNOSES: 1. History of breast cancer status post radical mastectomy and breast     reduction surgery on the left side. 2. Hyperlipidemia. 3. Hypothyroidism on Synthroid. 4. Anxiety. 5. Depression. 6. Nasal congestion.  DISCHARGE MEDICATIONS: 1. Oxycodone 5 mg IR q.6 h. p.r.n. for pain. 2. Crestor 10 mg 1 tablet daily. 3. Synthroid 1000 mcg one tablet daily. 4. Zolpidem 10 mg 1 tablet at bedtime. 5. Phentermine 37.5 one tablet daily. 6. Celexa 20 mg 1 tablet daily. 7. Omeprazole 20 mg 1 tablet daily. 8. Albuterol 2 puffs q.i.d. p.r.n. 9. Clindamycin 300 mg 1 tablet three times a day. 10.Claritin 10 mg 1 tablet daily.  PERTINENT LABS:  The patient had a CBC significant for WBC count of 12.2.  Basic metabolic panel significant for a glucose of 118.  TSH within normal limits.  Repeat CBC was within normal limits. Comprehensive metabolic panel showed an albumin of 3.1.  Lipid profile showed a normal LDL and HDL of 35.  Blood cultures have been negative so far.  Hemoglobin A1c 5.8.  DIAGNOSTIC STUDIES:  None.  The patient had right upper extremity Dopplers negative for DVT.  CONSULTATIONS:  None.  PROCEDURES:  None.  BRIEF HOSPITAL COURSE:  A 70 year old lady with history of right upper extremity chronic lymphedema from radical mastectomy on the right side, hyperlipidemia, depression, anxiety, hypothyroidism admitted for right upper extremity cellulitis, was on IV vancomycin and Levaquin for 48 hours.  Her cellulitis has dramatically  improved.  IV antibiotics were stopped, changed to p.o. clindamycin.  She will be discharged on p.o. clindamycin to complete a course of 10 days of antibiotics.  Venous Dopplers of the right upper extremity was done to rule out DVT.  Hyperlipidemia.  Continue with her Crestor.  Hypothyroidism.  TSH within normal limits.  Continue with her home dose of Synthroid.  Pain control.  Nasal congestion, gave her a prescription for Claritin.  PHYSICAL EXAMINATION:  VITAL SIGNS:  On the day of discharge, the patient's vitals include temperature 98.2, pulse of 82, respirations 18, blood pressure 140/80, saturating 94% on room air. GENERAL:  She is alert, afebrile, oriented x3, comfortable in no acute distress. CARDIOVASCULAR:  S1 and S2 heard. RESPIRATORY:  Chest clear to auscultation bilaterally. ABDOMEN:  Soft, nontender, nondistended.  Bowel sounds heard. EXTREMITIES:  Right upper extremity almost resolved, swelling persistent, tenderness has come down.  Follow up with PCP, Dr. Noel Gerold in about 1-2 weeks.          ______________________________ Kathlen Mody, MD     VA/MEDQ  D:  03/16/2011  T:  03/16/2011  Job:  [id number]  Electronically Signed by Kathlen Mody MD on 03/18/2011 10:42:59 PM

## 2011-03-18 NOTE — H&P (Signed)
Kathy Cooper, CHEA NO.:  [id number]  MEDICAL RECORD NO.:  [id number]  LOCATION:  5125                         FACILITY:  [facility]  PHYSICIAN:  Kathlen Mody, MD       DATE OF BIRTH:  12/30/1940  DATE OF ADMISSION:  03/14/2011 DATE OF DISCHARGE:                             HISTORY & PHYSICAL   PRIMARY CARE PHYSICIAN:  Dr. Juleen China.  CHIEF COMPLAINT:  Right arm swelling, erythema, and pain since 1 day.  HISTORY OF PRESENT ILLNESS:  This is a 70 year old lady with history of right-sided breast cancer status post radical mastectomy, hyperlipidemia, depression, Graves' disease status post radiation, with a history of cellulitis in 2009, comes in with similar complaints of right upper extremity pain, erythema, and swelling since 1 day.  The patient denies any fevers or chills.  The patient denies any injury to the right upper extremity.  She has not been on antibiotics recently. She denies any chest pain, shortness of breath, any syncopal episodes. Denies any nausea, vomiting, diarrhea, abdominal pain.  Denies any urinary complaints.  Denies any headache, blurry vision.  She denies any focal weakness of her upper extremities or any sensory deficits.  REVIEW OF SYSTEMS:  See HPI, otherwise negative.  PAST MEDICAL HISTORY: 1. History of breast cancer in 1996 status post radical mastectomy on     the right breast on the right side and breast reduction surgery on     the left side. 2. Hyperlipidemia. 3. Graves' disease status post radiation treatment, on Synthroid. 4. History of depression. 5. Anxiety.  HOME MEDICATIONS:  Please see med rec for detailed meds and their doses.  ALLERGIES:  No known drug allergies.  FAMILY HISTORY:  History of coronary artery disease in parents.  History of renal cancer in her brother.  Rheumatoid arthritis in her sister.  PHYSICAL EXAMINATION:  VITAL SIGNS:  She is afebrile, blood pressure 125/54, pulse is 79, respirations 20,  oxygen saturation 96% on room air. GENERAL:  She is alert, afebrile, oriented x3, slightly restless, in no acute distress. HEENT:  Pupils are reacting to light and accommodation.  No scleral icterus.  Dry mucous membranes. CARDIOVASCULAR:  S1-S2 heard.  No murmurs, rubs, or gallops. RESPIRATORY:  Chest clear to auscultation bilaterally.  No wheezing or rhonchi. ABDOMEN:  Soft, obese, nontender, nondistended.  Bowel sounds are heard. EXTREMITIES:  No pedal edema, cyanosis, or clubbing.  Right upper extremity erythema extending from the mid of the right upper arm up until the right palm. NEUROLOGIC:  Nonfocal at this time.  PERTINENT LABS:  The patient had a basic metabolic panel significant for a glucose of 118.  CBC significant for WBC count of 12.2.  RADIOLOGY:  None.  ASSESSMENT AND PLAN:  A 70 year old lady with a history of right upper extremity chronic lymphedema from radical mastectomy, hyperlipidemia, depression, anxiety, hypothyroidism, admitted for cellulitis of the right upper extremity.  She will be started on IV vancomycin and IV Levaquin and monitor her for 24 hours.  If the cellulitis is improving, we will change the IV antibiotics to p.o. antibiotics.  We will also start the patient on IV fluids normal saline 100 mL per hour.  We will also get an ultrasound Dopplers of the right upper extremity to rule out DVT.  Hyperlipidemia.  Continue with her Crestor.  Hypothyroidism.  Continue with her Synthroid.  Pain control.  DVT prophylaxis, subcutaneous Lovenox dosing as per the pharmacy.  The patient is full code.          ______________________________ Kathlen Mody, MD     VA/MEDQ  D:  03/14/2011  T:  03/14/2011  Job:  [id number]  Electronically Signed by Kathlen Mody MD on 03/18/2011 10:42:55 PM

## 2011-03-20 LAB — CULTURE, BLOOD (ROUTINE X 2)
Culture  Setup Time: 201210052122
Culture  Setup Time: 201210052122
Culture: NO GROWTH

## 2011-04-30 ENCOUNTER — Ambulatory Visit (INDEPENDENT_AMBULATORY_CARE_PROVIDER_SITE_OTHER): Payer: Medicare Other | Admitting: Surgery

## 2011-04-30 ENCOUNTER — Encounter (INDEPENDENT_AMBULATORY_CARE_PROVIDER_SITE_OTHER): Payer: Self-pay | Admitting: Surgery

## 2011-04-30 VITALS — BP 142/80 | HR 64 | Temp 97.0°F | Resp 20 | Ht 60.25 in | Wt 197.1 lb

## 2011-04-30 DIAGNOSIS — I890 Lymphedema, not elsewhere classified: Secondary | ICD-10-CM

## 2011-04-30 NOTE — Progress Notes (Signed)
Kathy Cooper comes in today after a long absence from my exams.  She had a right mastectomy and axillary dissection.  She has developed lymphedema her right arm and recently has had several bouts of cellulitis. 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 Between the 2 think she gets an ocular that can cause her to have cellulitis.  I examined her forearm on the right and felt several soft lipomatous nodules that she says her tender at times. It is curious but had other patients with fibromyalgia who complained of tenderness in these subcutaneous subcutaneous fat masses.  I recommended observation. I seen no evidence of any tumor in these. Pelvic c her again as needed. She feels very good about her follow up with Dr. Juleen China and Dr. Rana Snare (Gyn).  I will be glad to see her again as needed.

## 2011-12-29 ENCOUNTER — Inpatient Hospital Stay (HOSPITAL_COMMUNITY)
Admission: EM | Admit: 2011-12-29 | Discharge: 2012-01-01 | DRG: 603 | Disposition: A | Discharge status: Home / Self Care | Payer: Medicare Other | Attending: Internal Medicine | Admitting: Internal Medicine

## 2011-12-29 ENCOUNTER — Encounter (HOSPITAL_COMMUNITY): Payer: Self-pay | Admitting: *Deleted

## 2011-12-29 DIAGNOSIS — F329 Major depressive disorder, single episode, unspecified: Secondary | ICD-10-CM | POA: Diagnosis present

## 2011-12-29 DIAGNOSIS — E039 Hypothyroidism, unspecified: Secondary | ICD-10-CM | POA: Diagnosis present

## 2011-12-29 DIAGNOSIS — E785 Hyperlipidemia, unspecified: Secondary | ICD-10-CM | POA: Diagnosis present

## 2011-12-29 DIAGNOSIS — IMO0002 Reserved for concepts with insufficient information to code with codable children: Principal | ICD-10-CM | POA: Diagnosis present

## 2011-12-29 DIAGNOSIS — L0390 Cellulitis, unspecified: Secondary | ICD-10-CM | POA: Diagnosis present

## 2011-12-29 DIAGNOSIS — K219 Gastro-esophageal reflux disease without esophagitis: Secondary | ICD-10-CM | POA: Diagnosis present

## 2011-12-29 DIAGNOSIS — L03113 Cellulitis of right upper limb: Secondary | ICD-10-CM

## 2011-12-29 DIAGNOSIS — Z853 Personal history of malignant neoplasm of breast: Secondary | ICD-10-CM

## 2011-12-29 DIAGNOSIS — F411 Generalized anxiety disorder: Secondary | ICD-10-CM | POA: Diagnosis present

## 2011-12-29 DIAGNOSIS — E890 Postprocedural hypothyroidism: Secondary | ICD-10-CM | POA: Diagnosis present

## 2011-12-29 DIAGNOSIS — I890 Lymphedema, not elsewhere classified: Secondary | ICD-10-CM | POA: Diagnosis present

## 2011-12-29 DIAGNOSIS — F3289 Other specified depressive episodes: Secondary | ICD-10-CM | POA: Diagnosis present

## 2011-12-29 DIAGNOSIS — F419 Anxiety disorder, unspecified: Secondary | ICD-10-CM

## 2011-12-29 HISTORY — DX: Unspecified cataract: H26.9

## 2011-12-29 LAB — CBC WITH DIFFERENTIAL/PLATELET
Basophils Absolute: 0 10*3/uL (ref 0.0–0.1)
Basophils Relative: 0 % (ref 0–1)
Eosinophils Absolute: 0 10*3/uL (ref 0.0–0.7)
Eosinophils Relative: 0 % (ref 0–5)
HCT: 41.1 % (ref 36.0–46.0)
Hemoglobin: 13.5 g/dL (ref 12.0–15.0)
Lymphocytes Relative: 10 % — ABNORMAL LOW (ref 12–46)
Lymphs Abs: 1.1 10*3/uL (ref 0.7–4.0)
MCH: 29.3 pg (ref 26.0–34.0)
MCHC: 32.8 g/dL (ref 30.0–36.0)
MCV: 89.3 fL (ref 78.0–100.0)
Monocytes Absolute: 0.6 10*3/uL (ref 0.1–1.0)
Monocytes Relative: 6 % (ref 3–12)
Neutro Abs: 9.2 10*3/uL — ABNORMAL HIGH (ref 1.7–7.7)
Neutrophils Relative %: 84 % — ABNORMAL HIGH (ref 43–77)
Platelets: 184 10*3/uL (ref 150–400)
RBC: 4.6 MIL/uL (ref 3.87–5.11)
RDW: 13.9 % (ref 11.5–15.5)
WBC: 11 10*3/uL — ABNORMAL HIGH (ref 4.0–10.5)

## 2011-12-29 LAB — BASIC METABOLIC PANEL
BUN: 10 mg/dL (ref 6–23)
CO2: 22 mEq/L (ref 19–32)
Calcium: 9.6 mg/dL (ref 8.4–10.5)
Chloride: 100 mEq/L (ref 96–112)
Creatinine, Ser: 0.91 mg/dL (ref 0.50–1.10)
GFR calc Af Amer: 73 mL/min — ABNORMAL LOW (ref >90)
GFR calc non Af Amer: 63 mL/min — ABNORMAL LOW (ref >90)
Glucose, Bld: 116 mg/dL — ABNORMAL HIGH (ref 70–99)
Potassium: 3.9 mEq/L (ref 3.5–5.1)
Sodium: 133 mEq/L — ABNORMAL LOW (ref 135–145)

## 2011-12-29 LAB — D-DIMER, QUANTITATIVE (NOT AT ARMC): D-Dimer, Quant: 0.55 ug/mL-FEU — ABNORMAL HIGH (ref 0.00–0.48)

## 2011-12-29 MED ORDER — B-12 250 MCG PO TABS: 12.5000 ug | ORAL_TABLET | Freq: Every morning | ORAL | Status: DC

## 2011-12-29 MED ORDER — CITALOPRAM HYDROBROMIDE 20 MG PO TABS
20.0000 mg | ORAL_TABLET | Freq: Every day | ORAL | Status: DC
  Administered 2011-12-30 – 2012-01-01 (×3): 20 mg via ORAL
  Filled 2011-12-29 (×3): qty 1

## 2011-12-29 MED ORDER — OXYCODONE HCL 5 MG PO TABS
5.0000 mg | ORAL_TABLET | ORAL | Status: DC | PRN
  Administered 2011-12-29 – 2011-12-31 (×5): 5 mg via ORAL
  Filled 2011-12-29 (×5): qty 1

## 2011-12-29 MED ORDER — SODIUM CHLORIDE 0.9 % IV SOLN: 250.0000 mL | INTRAVENOUS | Status: DC | PRN

## 2011-12-29 MED ORDER — ACETAMINOPHEN 650 MG RE SUPP: 650.0000 mg | Freq: Four times a day (QID) | RECTAL | Status: DC | PRN

## 2011-12-29 MED ORDER — ENOXAPARIN SODIUM 40 MG/0.4ML ~~LOC~~ SOLN
40.0000 mg | SUBCUTANEOUS | Status: DC
  Administered 2011-12-29 – 2011-12-31 (×3): 40 mg via SUBCUTANEOUS
  Filled 2011-12-29 (×4): qty 0.4

## 2011-12-29 MED ORDER — PANTOPRAZOLE SODIUM 40 MG PO TBEC
40.0000 mg | DELAYED_RELEASE_TABLET | Freq: Every day | ORAL | Status: DC
  Administered 2011-12-30 – 2012-01-01 (×3): 40 mg via ORAL
  Filled 2011-12-29 (×3): qty 1

## 2011-12-29 MED ORDER — LEVOTHYROXINE SODIUM 100 MCG PO TABS
100.0000 ug | ORAL_TABLET | Freq: Every day | ORAL | Status: DC
  Administered 2011-12-30 – 2012-01-01 (×3): 100 ug via ORAL
  Filled 2011-12-29 (×4): qty 1

## 2011-12-29 MED ORDER — SODIUM CHLORIDE 0.9 % IJ SOLN: 3.0000 mL | INTRAMUSCULAR | Status: DC | PRN

## 2011-12-29 MED ORDER — VANCOMYCIN HCL 1000 MG IV SOLR
1500.0000 mg | INTRAVENOUS | Status: DC
  Administered 2011-12-30 – 2012-01-01 (×3): 1500 mg via INTRAVENOUS
  Filled 2011-12-29 (×4): qty 1500

## 2011-12-29 MED ORDER — ONDANSETRON HCL 4 MG/2ML IJ SOLN: 4.0000 mg | Freq: Four times a day (QID) | INTRAMUSCULAR | Status: DC | PRN

## 2011-12-29 MED ORDER — ZOLPIDEM TARTRATE 10 MG PO TABS
10.0000 mg | ORAL_TABLET | Freq: Every evening | ORAL | Status: DC | PRN
  Administered 2011-12-29 – 2011-12-31 (×3): 10 mg via ORAL
  Filled 2011-12-29 (×3): qty 1

## 2011-12-29 MED ORDER — SODIUM CHLORIDE 0.9 % IJ SOLN
3.0000 mL | Freq: Two times a day (BID) | INTRAMUSCULAR | Status: DC
  Administered 2011-12-29 – 2011-12-30 (×3): 3 mL via INTRAVENOUS

## 2011-12-29 MED ORDER — PHENTERMINE HCL 37.5 MG PO CAPS: 37.5000 mg | ORAL_CAPSULE | ORAL | Status: DC

## 2011-12-29 MED ORDER — POLYETHYLENE GLYCOL 3350 17 G PO PACK
17.0000 g | PACK | Freq: Every day | ORAL | Status: DC | PRN
  Filled 2011-12-29: qty 1

## 2011-12-29 MED ORDER — PIPERACILLIN-TAZOBACTAM 3.375 G IVPB
3.3750 g | Freq: Three times a day (TID) | INTRAVENOUS | Status: DC
  Administered 2011-12-29 – 2012-01-01 (×8): 3.375 g via INTRAVENOUS
  Filled 2011-12-29 (×11): qty 50

## 2011-12-29 MED ORDER — CYANOCOBALAMIN 250 MCG PO TABS
250.0000 ug | ORAL_TABLET | Freq: Every day | ORAL | Status: DC
  Administered 2011-12-30 – 2012-01-01 (×3): 250 ug via ORAL
  Filled 2011-12-29 (×3): qty 1

## 2011-12-29 MED ORDER — PIPERACILLIN-TAZOBACTAM 3.375 G IVPB
3.3750 g | Freq: Once | INTRAVENOUS | Status: AC
  Administered 2011-12-29: 3.375 g via INTRAVENOUS
  Filled 2011-12-29: qty 50

## 2011-12-29 MED ORDER — ALBUTEROL SULFATE HFA 108 (90 BASE) MCG/ACT IN AERS
2.0000 | INHALATION_SPRAY | Freq: Four times a day (QID) | RESPIRATORY_TRACT | Status: DC | PRN
Start: 2011-12-29 — End: 2012-01-01
  Filled 2011-12-29: qty 6.7

## 2011-12-29 MED ORDER — ACETAMINOPHEN 325 MG PO TABS: 650.0000 mg | ORAL_TABLET | Freq: Four times a day (QID) | ORAL | Status: DC | PRN

## 2011-12-29 MED ORDER — VANCOMYCIN HCL IN DEXTROSE 1-5 GM/200ML-% IV SOLN
1000.0000 mg | Freq: Once | INTRAVENOUS | Status: AC
  Administered 2011-12-29: 1000 mg via INTRAVENOUS
  Filled 2011-12-29: qty 200

## 2011-12-29 MED ORDER — ALUM & MAG HYDROXIDE-SIMETH 200-200-20 MG/5ML PO SUSP: 30.0000 mL | Freq: Four times a day (QID) | ORAL | Status: DC | PRN

## 2011-12-29 MED ORDER — ATORVASTATIN CALCIUM 20 MG PO TABS
20.0000 mg | ORAL_TABLET | Freq: Every day | ORAL | Status: DC
  Administered 2011-12-29 – 2011-12-31 (×3): 20 mg via ORAL
  Filled 2011-12-29 (×4): qty 1

## 2011-12-29 MED ORDER — ONDANSETRON HCL 4 MG PO TABS
4.0000 mg | ORAL_TABLET | Freq: Four times a day (QID) | ORAL | Status: DC | PRN
  Filled 2011-12-29: qty 1

## 2011-12-29 NOTE — ED Notes (Signed)
PA at bedside.

## 2011-12-29 NOTE — ED Provider Notes (Signed)
History     CSN: [id number]  Arrival date & time 12/29/11  7846   First MD Initiated Contact with Patient 12/29/11 1043      Chief Complaint  Patient presents with  . Recurrent Skin Infections    (Consider location/radiation/quality/duration/timing/severity/associated sxs/prior treatment) HPI Patient presents emergency department with right arm redness and swelling.  The patient states, that this started 2 days ago, but worsened over last 12 hours.  Patient, states she has had this problem intermittently over the last several years.  She, states she started Keflex a day, and a half ago, and has not seen any relief.  Patient, states she had some mild fever last night.  Patient denies, nausea, vomiting, or weakness.  Patient, tried to call her primary doctor and was unable to reach them this morning. Past Medical History  Diagnosis Date  . Anxiety   . Breast cancer   . Psoriasis   . Fibroids   . Asthma   . Barrett's esophagus   . Colon polyps   . Diverticulosis   . Fibromyalgia   . Hyperlipemia   . Grave's disease   . Obesity   . Pneumonia   . Cellulitis     Past Surgical History  Procedure Date  . Thyroidectomy   . Mastectomy   . Appendectomy   . Rotator cuff repair 2009    right arm   . Breast surgery     R mastecomty, L reduction    Family History  Problem Relation Age of Onset  . Breast cancer    . Diabetes    . Heart disease    . Kidney disease Brother     History  Substance Use Topics  . Smoking status: Former Smoker -- 25 years    Quit date: 12/29/1994  . Smokeless tobacco: Former Neurosurgeon    Quit date: 12/29/1995  . Alcohol Use: No    OB History    Grav Para Term Preterm Abortions TAB SAB Ect Mult Living                  Review of Systems All other systems negative except as documented in the HPI. All pertinent positives and negatives as reviewed in the HPI.  Allergies  Review of patient's allergies indicates no known allergies.  Home  Medications   Current Outpatient Rx  Name Route Sig Dispense Refill  . ALBUTEROL SULFATE HFA 108 (90 BASE) MCG/ACT IN AERS Inhalation Inhale 2 puffs into the lungs every 6 (six) hours as needed. For shortness of breath    . ATORVASTATIN CALCIUM 20 MG PO TABS Oral Take 20 mg by mouth daily.    . CEPHALEXIN 500 MG PO CAPS Oral Take 500 mg by mouth 4 (four) times daily.    Marland Kitchen CITALOPRAM HYDROBROMIDE 20 MG PO TABS Oral Take 20 mg by mouth daily.      . B-12 PO Oral Take 0.5 tablets by mouth daily.    Marland Kitchen LEVOTHYROXINE SODIUM 100 MCG PO TABS Oral Take 100 mcg by mouth daily.      Marland Kitchen OMEPRAZOLE 20 MG PO CPDR Oral Take 20 mg by mouth daily.      Marland Kitchen PHENTERMINE HCL 37.5 MG PO CAPS Oral Take 37.5 mg by mouth every morning.      Marland Kitchen ZOLPIDEM TARTRATE 10 MG PO TABS Oral Take 10 mg by mouth at bedtime as needed. For insomnia      BP 135/56  Pulse 87  Temp 98.3 F (36.8 C) (  Oral)  Resp 18  Wt 194 lb (87.998 kg)  SpO2 95%  Physical Exam  Nursing note and vitals reviewed. Constitutional: She is oriented to person, place, and time. She appears well-developed and well-nourished.  HENT:  Head: Normocephalic and atraumatic.  Mouth/Throat: Oropharynx is clear and moist.  Eyes: Pupils are equal, round, and reactive to light.  Neck: Normal range of motion. Neck supple.  Cardiovascular: Normal rate and regular rhythm.   Pulmonary/Chest: Effort normal and breath sounds normal.  Neurological: She is alert and oriented to person, place, and time.  Skin: Skin is warm and dry. There is erythema.       ED Course  Procedures (including critical care time)  Labs Reviewed  CBC WITH DIFFERENTIAL - Abnormal; Notable for the following:    WBC 11.0 (*)     Neutrophils Relative 84 (*)     Neutro Abs 9.2 (*)     Lymphocytes Relative 10 (*)     All other components within normal limits  BASIC METABOLIC PANEL - Abnormal; Notable for the following:    Sodium 133 (*)     Glucose, Bld 116 (*)     GFR calc non Af  Amer 63 (*)     GFR calc Af Amer 73 (*)     All other components within normal limits  D-DIMER, QUANTITATIVE - Abnormal; Notable for the following:    D-Dimer, Quant 0.55 (*)     All other components within normal limits   No results found.   1. Cellulitis of arm, right    The patient will be admitted for IV antibiotics and further care of her R arm cellulitis. Hospitalist has been called.   MDM         Carlyle Dolly, PA-C 12/31/11 [phone]

## 2011-12-29 NOTE — ED Notes (Signed)
Pt noticed redness on right arm yesterday with pain that comes and goes, hx of cellulitis, Pt starting taking an antibiotic yesterday, keflex and taking oxycodone; sts she had cold chills last night

## 2011-12-29 NOTE — Progress Notes (Signed)
ANTIBIOTIC CONSULT NOTE - INITIAL  Pharmacy Consult for Vancomycin and Zosyn Indication: Cellulitis   No Known Allergies  Patient Measurements: Height: 5\' 1"  (154.9 cm) Weight: 194 lb (87.998 kg) IBW/kg (Calculated) : 47.8  Adjusted Body Weight:   Vital Signs: Temp: 98.9 F (37.2 C) (07/22 1310) Temp src: Oral (07/22 1310) BP: 147/81 mmHg (07/22 1310) Pulse Rate: 90  (07/22 1310) Intake/Output from previous day:   Intake/Output from this shift:    Labs:  Basename 12/29/11 1135  WBC 11.0*  HGB 13.5  PLT 184  LABCREA --  CREATININE 0.91   Estimated Creatinine Clearance: 58.9 ml/min (by C-G formula based on Cr of 0.91). No results found for this basename: VANCOTROUGH:2,VANCOPEAK:2,VANCORANDOM:2,GENTTROUGH:2,GENTPEAK:2,GENTRANDOM:2,TOBRATROUGH:2,TOBRAPEAK:2,TOBRARND:2,AMIKACINPEAK:2,AMIKACINTROU:2,AMIKACIN:2, in the last 72 hours   Microbiology: No results found for this or any previous visit (from the past 720 hour(s)).  Medical History: Past Medical History  Diagnosis Date  . Anxiety   . Breast cancer   . Psoriasis   . Fibroids   . Asthma   . Barrett's esophagus   . Colon polyps   . Diverticulosis   . Fibromyalgia   . Hyperlipemia   . Grave's disease   . Obesity   . Pneumonia   . Cellulitis   . Cataracts, bilateral     Medications:  Prescriptions prior to admission  Medication Sig Dispense Refill  . albuterol (PROVENTIL HFA;VENTOLIN HFA) 108 (90 BASE) MCG/ACT inhaler Inhale 2 puffs into the lungs every 6 (six) hours as needed. For shortness of breath      . atorvastatin (LIPITOR) 20 MG tablet Take 20 mg by mouth daily.      . cephALEXin (KEFLEX) 500 MG capsule Take 500 mg by mouth 4 (four) times daily.      . citalopram (CELEXA) 20 MG tablet Take 20 mg by mouth daily.        . Cyanocobalamin (B-12 PO) Take 0.5 tablets by mouth daily.      Marland Kitchen levothyroxine (SYNTHROID, LEVOTHROID) 100 MCG tablet Take 100 mcg by mouth daily.        Marland Kitchen omeprazole (PRILOSEC)  20 MG capsule Take 20 mg by mouth daily.        . phentermine 37.5 MG capsule Take 37.5 mg by mouth every morning.        . zolpidem (AMBIEN) 10 MG tablet Take 10 mg by mouth at bedtime as needed. For insomnia       Scheduled:    . atorvastatin  20 mg Oral q1800  . citalopram  20 mg Oral Daily  . enoxaparin (LOVENOX) injection  40 mg Subcutaneous Q24H  . levothyroxine  100 mcg Oral QAC breakfast  . pantoprazole  40 mg Oral Q1200  . piperacillin-tazobactam (ZOSYN)  IV  3.375 g Intravenous Once  . piperacillin-tazobactam (ZOSYN)  IV  3.375 g Intravenous Q8H  . sodium chloride  3 mL Intravenous Q12H  . vancomycin  1,500 mg Intravenous Q24H  . vancomycin  1,000 mg Intravenous Once  . vitamin B-12  250 mcg Oral Daily  . DISCONTD: B-12  12.5 mcg Oral q morning - 10a  . DISCONTD: phentermine  37.5 mg Oral BH-q7a   Infusions:   Assessment: 71 yo F with PMH of breast cancer and chornic recurrent cellulitis to start empiric zosyn and vancomycin.  Patient already received first dose of zosyn in ER at 1330 and vancomycin 1 g at 1200 on 7/22.  Goal of Therapy:  Vancomycin trough level 10-15 mcg/ml  Plan:  1. Start zosyn  3.375 g IV q 8 hours (infuse over 4 hours) at 2100 tonight (first dose given in ER). 2. Start Vancomycin 1500 mg IV q 24 hours tomorrow at 0800 (first dose given in ER).  3. Will obtain vanc trough at steady state and adjust accordingly.  Lincoln Kleiner, Lorra Hals 12/29/2011,2:30 PM

## 2011-12-29 NOTE — H&P (Signed)
[facility] History and Physical  Kathy Cooper MRN:[id number] DOB: 12/12/1940 DOA: 12/29/2011  Referring physician: Dr. Kevin Steinl PCP: KOHUT,WALTER DENNIS, MD  Oncologist: Dr. Murinson  Chief Complaint: RUE redness, swelling and pain  History of Present Illness: Kathy Cooper is an 71 y.o. female with a PMH of breast cancer status post mastectomy and lymph node resection.  The patient has chronic right upper extremity lymphedema and has had recurrent cellulitis for which she has a prescription for Keflex and gets this filled when her cellulitis flares up.  The patient reports that over the past 24 hours, her RUE developed worsening erythema, swelling, weeping of fluids, and throbbing pain.  She started her Keflex yesterday, but despite this, her symptoms have worsened.  No frank fever, but felt cold last night.  Temperature was low grade this morning.  Review of Systems: Constitutional: +fever (low grade) and chills;  Appetite diminished x 2 days;  No weight loss, no weight gain.  HEENT: No blurry vision, no diplopia, no pharyngitis, no dysphagia, wears glasses CV: No chest pain, occasional palpitations.  Resp: + occasional SOB, and chronic cough. GI: No nausea, no vomiting, no diarrhea, no melena, no hematochezia, + chronic reflux.  GU: No dysuria, no hematuria.  MSK: +occasional myalgias and arthralgias.  Neuro:  No headache, no focal neurological deficits, no history of seizures.  Psych: +Occasional depression and anxiety.  Endo: + thyroid disease, no DM, no heat intolerance, no cold intolerance, no polyuria, no polydipsia  Skin: No rashes, RUE with erythema.  Heme: No easy bruising, no history of blood diseases.  Past Medical History Past Medical History  Diagnosis Date  . Anxiety   . Breast cancer   . Psoriasis   . Fibroids   . Asthma   . Barrett's esophagus   . Colon polyps   . Diverticulosis   . Fibromyalgia   . Hyperlipemia   . Grave's disease   . Obesity   .  Pneumonia   . Cellulitis   . Cataracts, bilateral      Past Surgical History Past Surgical History  Procedure Date  . Thyroidectomy   . Mastectomy   . Appendectomy   . Rotator cuff repair 2009    right arm   . Breast surgery     R mastecomty, L reduction     Social History: History   Social History  . Marital Status: Married    Spouse Name: Jerry    Number of Children: 4  . Years of Education: 14   Occupational History  . Retired, accounting    Social History Main Topics  . Smoking status: Former Smoker -- 25 years    Quit date: 12/29/1994  . Smokeless tobacco: Former User    Quit date: 12/29/1995  . Alcohol Use: No  . Drug Use: No  . Sexually Active: Not on file   Other Topics Concern  . Not on file   Social History Narrative   Divorced and Re-Married.  Lives with husband.  Independent with ADLs and with ambulation.    Family History:  Family History  Problem Relation Age of Onset  . Diabetes Brother   . Heart disease Mother   . Kidney disease Brother   . Asthma Mother   . Stroke Father   . Heart failure Sister   .       Allergies: Review of patient's allergies indicates no known allergies.  Meds: Prior to Admission medications   Medication Sig Start Date End   Date Taking? Authorizing Provider  albuterol (PROVENTIL HFA;VENTOLIN HFA) 108 (90 BASE) MCG/ACT inhaler Inhale 2 puffs into the lungs every 6 (six) hours as needed. For shortness of breath   Yes Historical Provider, MD  atorvastatin (LIPITOR) 20 MG tablet Take 20 mg by mouth daily.   Yes Historical Provider, MD  cephALEXin (KEFLEX) 500 MG capsule Take 500 mg by mouth 4 (four) times daily.   Yes Historical Provider, MD  citalopram (CELEXA) 20 MG tablet Take 20 mg by mouth daily.     Yes Historical Provider, MD  Cyanocobalamin (B-12 PO) Take 0.5 tablets by mouth daily.   Yes Historical Provider, MD  levothyroxine (SYNTHROID, LEVOTHROID) 100 MCG tablet Take 100 mcg by mouth daily.     Yes  Historical Provider, MD  omeprazole (PRILOSEC) 20 MG capsule Take 20 mg by mouth daily.     Yes Historical Provider, MD  phentermine 37.5 MG capsule Take 37.5 mg by mouth every morning.     Yes Historical Provider, MD  zolpidem (AMBIEN) 10 MG tablet Take 10 mg by mouth at bedtime as needed. For insomnia   Yes Historical Provider, MD    Physical Exam: Filed Vitals:   12/29/11 1041 12/29/11 1214 12/29/11 1310  BP: 147/49 135/56 147/81  Pulse: 80 87 90  Temp: 99.2 F (37.3 C) 98.3 F (36.8 C) 98.9 F (37.2 C)  TempSrc: Oral Oral Oral  Resp: 18 18 18  Height:   5' 1" (1.549 m)  Weight: 87.998 kg (194 lb)  87.998 kg (194 lb)  SpO2: 92% 95% 97%     Physical Exam: Blood pressure 147/81, pulse 90, temperature 98.9 F (37.2 C), temperature source Oral, resp. rate 18, height 5' 1" (1.549 m), weight 87.998 kg (194 lb), SpO2 97.00%. BP 147/81  Pulse 90  Temp 98.9 F (37.2 C) (Oral)  Resp 18  Ht 5' 1" (1.549 m)  Wt 87.998 kg (194 lb)  BMI 36.66 kg/m2  SpO2 97%  General Appearance:    Alert, cooperative, no distress, appears stated age  Head:    Normocephalic, without obvious abnormality, atraumatic  Eyes:    PERRL, conjunctiva/corneas clear, EOM's intact  Ears:    Normal TM's both ears  Nose:   Nares normal, septum midline, mucosa normal, no drainage    or sinus tenderness  Throat:   Lips, mucosa, and tongue normal; teeth and gums in good condition  Neck:   Supple, symmetrical, trachea midline, no adenopathy;    thyroid:  no enlargement/tenderness/nodules; no carotid   bruit or JVD  Back:     Symmetric, no curvature, ROM normal, no CVA tenderness  Lungs:     Clear to auscultation bilaterally, respirations unlabored  Chest Wall:    No tenderness or deformity   Heart:    Regular rate and rhythm, S1 and S2 normal, no murmur, rub   or gallop  Abdomen:     Soft, non-tender, bowel sounds active all four quadrants,    no masses, no organomegaly  Extremities:   RUE with erythema,  swelling, extending from wrist to shoulder  Pulses:   2+ and symmetric all extremities  Skin:   Skin color, texture, turgor normal, no rashes or lesions  Neurologic:   Non-focal    Labs on Admission:  Basic Metabolic Panel:  Lab 12/29/11 1135  NA 133*  K 3.9  CL 100  CO2 22  GLUCOSE 116*  BUN 10  CREATININE 0.91  CALCIUM 9.6  MG --  PHOS --     CBC:  Lab 12/29/11 1135  WBC 11.0*  NEUTROABS 9.2*  HGB 13.5  HCT 41.1  MCV 89.3  PLT 184    Radiological Exams on Admission: No results found.   Assessment/Plan Principal Problem:  *RUE Cellulitis  Admit and place on empiric Vancomycin and Zosyn. Active Problems:  Chronic acquired lymphedema  Secondary to h/o breast CA with right mastectomy.  Keep right arm elevated.  Hyperlipidemia  Continue Lipitor.  Hypothyroidism  Continue synthroid.  Anxiety and depression  Continue Celexa.  GERD (gastroesophageal reflux disease)  Continue PPI.    Code Status: Full Family Communication: Husband Jerry Bartol [phone]. Disposition Plan: Home when stable.  Time spent: 1 hour.  Chrystine Frogge [facility] Pager [phone]  If 7PM-7AM, please contact night-coverage [email or web address] Password [email or web address] 12/29/2011, 2:12 PM     

## 2011-12-29 NOTE — ED Notes (Signed)
Waiting for antibiotics from pharmacy. No further needs at this time.

## 2011-12-29 NOTE — Progress Notes (Deleted)
[facility] History and Physical  [name] WJX:[id number] DOB: [birth year] DOA: 12/29/2011  Referring physician: Dr. Cathren Laine PCP: Michiel Sites, MD  Oncologist: Dr. Arline Asp  Chief Complaint: RUE redness, swelling and pain  History of Present Illness: Kathy Cooper is an 71 y.o. female with a PMH of breast cancer status post mastectomy and lymph node resection.  The patient has chronic right upper extremity lymphedema and has had recurrent cellulitis for which she has a prescription for Keflex and gets this filled when her cellulitis flares up.  The patient reports that over the past 24 hours, her RUE developed worsening erythema, swelling, weeping of fluids, and throbbing pain.  She started her Keflex yesterday, but despite this, her symptoms have worsened.  No frank fever, but felt cold last night.  Temperature was low grade this morning.  Review of Systems: Constitutional: +fever (low grade) and chills;  Appetite diminished x 2 days;  No weight loss, no weight gain.  HEENT: No blurry vision, no diplopia, no pharyngitis, no dysphagia, wears glasses CV: No chest pain, occasional palpitations.  Resp: + occasional SOB, and chronic cough. GI: No nausea, no vomiting, no diarrhea, no melena, no hematochezia, + chronic reflux.  GU: No dysuria, no hematuria.  MSK: +occasional myalgias and arthralgias.  Neuro:  No headache, no focal neurological deficits, no history of seizures.  Psych: +Occasional depression and anxiety.  Endo: + thyroid disease, no DM, no heat intolerance, no cold intolerance, no polyuria, no polydipsia  Skin: No rashes, RUE with erythema.  Heme: No easy bruising, no history of blood diseases.  Past Medical History Past Medical History  Diagnosis Date  . Anxiety   . Breast cancer   . Psoriasis   . Fibroids   . Asthma   . Barrett's esophagus   . Colon polyps   . Diverticulosis   . Fibromyalgia   . Hyperlipemia   . Grave's disease   . Obesity   .  Pneumonia   . Cellulitis   . Cataracts, bilateral      Past Surgical History Past Surgical History  Procedure Date  . Thyroidectomy   . Mastectomy   . Appendectomy   . Rotator cuff repair 2009    right arm   . Breast surgery     R mastecomty, L reduction     Social History: History   Social History  . Marital Status: Married    Spouse Name: Dorene Sorrow    Number of Children: 4  . Years of Education: 14   Occupational History  . Retired, Audiological scientist    Social History Main Topics  . Smoking status: Former Smoker -- 25 years    Quit date: 12/29/1994  . Smokeless tobacco: Former Neurosurgeon    Quit date: 12/29/1995  . Alcohol Use: No  . Drug Use: No  . Sexually Active: Not on file   Other Topics Concern  . Not on file   Social History Narrative   Divorced and Re-Married.  Lives with husband.  Independent with ADLs and with ambulation.    Family History:  Family History  Problem Relation Age of Onset  . Diabetes Brother   . Heart disease Mother   . Kidney disease Brother   . Asthma Mother   . Stroke Father   . Heart failure Sister   .       Allergies: Review of patient's allergies indicates no known allergies.  Meds: Prior to Admission medications   Medication Sig Start Date End  Date Taking? Authorizing Provider  albuterol (PROVENTIL HFA;VENTOLIN HFA) 108 (90 BASE) MCG/ACT inhaler Inhale 2 puffs into the lungs every 6 (six) hours as needed. For shortness of breath   Yes Historical Provider, MD  atorvastatin (LIPITOR) 20 MG tablet Take 20 mg by mouth daily.   Yes Historical Provider, MD  cephALEXin (KEFLEX) 500 MG capsule Take 500 mg by mouth 4 (four) times daily.   Yes Historical Provider, MD  citalopram (CELEXA) 20 MG tablet Take 20 mg by mouth daily.     Yes Historical Provider, MD  Cyanocobalamin (B-12 PO) Take 0.5 tablets by mouth daily.   Yes Historical Provider, MD  levothyroxine (SYNTHROID, LEVOTHROID) 100 MCG tablet Take 100 mcg by mouth daily.     Yes  Historical Provider, MD  omeprazole (PRILOSEC) 20 MG capsule Take 20 mg by mouth daily.     Yes Historical Provider, MD  phentermine 37.5 MG capsule Take 37.5 mg by mouth every morning.     Yes Historical Provider, MD  zolpidem (AMBIEN) 10 MG tablet Take 10 mg by mouth at bedtime as needed. For insomnia   Yes Historical Provider, MD    Physical Exam: Filed Vitals:   12/29/11 1041 12/29/11 1214 12/29/11 1310  BP: 147/49 135/56 147/81  Pulse: 80 87 90  Temp: 99.2 F (37.3 C) 98.3 F (36.8 C) 98.9 F (37.2 C)  TempSrc: Oral Oral Oral  Resp: 18 18 18   Height:   5\' 1"  (1.549 m)  Weight: 87.998 kg (194 lb)  87.998 kg (194 lb)  SpO2: 92% 95% 97%     Physical Exam: Blood pressure 147/81, pulse 90, temperature 98.9 F (37.2 C), temperature source Oral, resp. rate 18, height 5\' 1"  (1.549 m), weight 87.998 kg (194 lb), SpO2 97.00%. BP 147/81  Pulse 90  Temp 98.9 F (37.2 C) (Oral)  Resp 18  Ht 5\' 1"  (1.549 m)  Wt 87.998 kg (194 lb)  BMI 36.66 kg/m2  SpO2 97%  General Appearance:    Alert, cooperative, no distress, appears stated age  Head:    Normocephalic, without obvious abnormality, atraumatic  Eyes:    PERRL, conjunctiva/corneas clear, EOM's intact  Ears:    Normal TM's both ears  Nose:   Nares normal, septum midline, mucosa normal, no drainage    or sinus tenderness  Throat:   Lips, mucosa, and tongue normal; teeth and gums in good condition  Neck:   Supple, symmetrical, trachea midline, no adenopathy;    thyroid:  no enlargement/tenderness/nodules; no carotid   bruit or JVD  Back:     Symmetric, no curvature, ROM normal, no CVA tenderness  Lungs:     Clear to auscultation bilaterally, respirations unlabored  Chest Wall:    No tenderness or deformity   Heart:    Regular rate and rhythm, S1 and S2 normal, no murmur, rub   or gallop  Abdomen:     Soft, non-tender, bowel sounds active all four quadrants,    no masses, no organomegaly  Extremities:   RUE with erythema,  swelling, extending from wrist to shoulder  Pulses:   2+ and symmetric all extremities  Skin:   Skin color, texture, turgor normal, no rashes or lesions  Neurologic:   Non-focal    Labs on Admission:  Basic Metabolic Panel:  Lab 12/29/11 2130  NA 133*  K 3.9  CL 100  CO2 22  GLUCOSE 116*  BUN 10  CREATININE 0.91  CALCIUM 9.6  MG --  PHOS --  CBC:  Lab 12/29/11 1135  WBC 11.0*  NEUTROABS 9.2*  HGB 13.5  HCT 41.1  MCV 89.3  PLT 184    Radiological Exams on Admission: No results found.   Assessment/Plan Principal Problem:  *RUE Cellulitis  Admit and place on empiric Vancomycin and Zosyn. Active Problems:  Chronic acquired lymphedema  Secondary to h/o breast CA with right mastectomy.  Keep right arm elevated.  Hyperlipidemia  Continue Lipitor.  Hypothyroidism  Continue synthroid.  Anxiety and depression  Continue Celexa.  GERD (gastroesophageal reflux disease)  Continue PPI.    Code Status: Full Family Communication: Husband Darnette Lampron [phone]. Disposition Plan: Home when stable.  Time spent: 1 hour.  Jarren Para [facility] Pager [phone]  If 7PM-7AM, please contact night-coverage [email or web address] Password [email or web address] 12/29/2011, 2:12 PM

## 2011-12-30 LAB — CBC
MCV: 88.7 fL (ref 78.0–100.0)
Platelets: 187 10*3/uL (ref 150–400)
RBC: 4.23 MIL/uL (ref 3.87–5.11)
RDW: 14.2 % (ref 11.5–15.5)
WBC: 9.6 10*3/uL (ref 4.0–10.5)

## 2011-12-30 MED ORDER — DIPHENHYDRAMINE HCL 50 MG PO CAPS
50.0000 mg | ORAL_CAPSULE | Freq: Four times a day (QID) | ORAL | Status: DC | PRN
  Administered 2011-12-31 – 2012-01-01 (×2): 50 mg via ORAL
  Filled 2011-12-30 (×3): qty 1

## 2011-12-30 NOTE — Progress Notes (Signed)
[facility] PROGRESS NOTE  Kathy Cooper VHQ:[id number] DOB: 24-Nov-1940 DOA: 12/29/2011 PCP: Michiel Sites, MD  Assessment/Plan: Principal Problem:  *RUE Cellulitis  Admitted and placed on empiric Vancomycin and Zosyn. Active Problems:  Chronic acquired lymphedema  Secondary to h/o breast CA with right mastectomy.  Keep right arm elevated. Hyperlipidemia  Continue Lipitor. Hypothyroidism  Continue synthroid. Anxiety and depression  Continue Celexa. GERD (gastroesophageal reflux disease)  Continue PPI.    Code Status: Full  Family Communication: Husband Kathy Cooper [phone].  Disposition Plan: Home when stable  Brief narrative: Kathy Cooper is an 71 y.o. female with a PMH of breast cancer status post mastectomy and lymph node resection. The patient has chronic right upper extremity lymphedema and has had recurrent cellulitis.  Admitted 12/29/11 with RUE cellulitis that failed outpatient treatment with Keflex.   Medical Consultants:  None  Other Consultants:  None  Procedures:  None  Antibiotics:  Zosyn 12/29/11--->  Vancomycin 12/29/11--->  HPI/Subjective: Kathy Cooper feels a bit "washed out" and fatigued.  No nausea, vomiting or diarrhea.  No fevers.  Appetite fair.  Objective: Filed Vitals:   12/29/11 1214 12/29/11 1310 12/29/11 2104 12/30/11 0529  BP: 135/56 147/81 108/66 137/54  Pulse: 87 90 78 82  Temp: 98.3 F (36.8 C) 98.9 F (37.2 C) 98.4 F (36.9 C) 98.4 F (36.9 C)  TempSrc: Oral Oral Oral Oral  Resp: 18 18 18 17   Height:  5\' 1"  (1.549 m)    Weight:  87.998 kg (194 lb)    SpO2: 95% 97% 96% 97%    Intake/Output Summary (Last 24 hours) at 12/30/11 1027 Last data filed at 12/29/11 2209  Gross per 24 hour  Intake    483 ml  Output      0 ml  Net    483 ml    Exam: Gen:  NAD Cardiovascular:  RRR, No M/R/G Respiratory: Lungs CTAB Gastrointestinal: Abdomen soft, NT/ND with normal active bowel sounds. Extremities: No  C/E/C  Data Reviewed: Basic Metabolic Panel:  Lab 12/29/11 2536  NA 133*  K 3.9  CL 100  CO2 22  GLUCOSE 116*  BUN 10  CREATININE 0.91  CALCIUM 9.6  MG --  PHOS --   GFR Estimated Creatinine Clearance: 58.9 ml/min (by C-G formula based on Cr of 0.91).  CBC:  Lab 12/30/11 0441 12/29/11 1135  WBC 9.6 11.0*  NEUTROABS -- 9.2*  HGB 12.5 13.5  HCT 37.5 41.1  MCV 88.7 89.3  PLT 187 184   D-Dimer  Basename 12/29/11 1135  DDIMER 0.55*    Studies: No results found.  Scheduled Meds:   . atorvastatin  20 mg Oral q1800  . citalopram  20 mg Oral Daily  . enoxaparin (LOVENOX) injection  40 mg Subcutaneous Q24H  . levothyroxine  100 mcg Oral QAC breakfast  . pantoprazole  40 mg Oral Q1200  . piperacillin-tazobactam (ZOSYN)  IV  3.375 g Intravenous Once  . piperacillin-tazobactam (ZOSYN)  IV  3.375 g Intravenous Q8H  . sodium chloride  3 mL Intravenous Q12H  . vancomycin  1,500 mg Intravenous Q24H  . vancomycin  1,000 mg Intravenous Once  . vitamin B-12  250 mcg Oral Daily  . DISCONTD: B-12  12.5 mcg Oral q morning - 10a  . DISCONTD: phentermine  37.5 mg Oral BH-q7a   Continuous Infusions:   Time spent: 25 minutes.   LOS: 1 day   Garik Diamant  [facility] Pager [phone].  If 8PM-8AM, please contact night-coverage at  [email or web address], password [email or web address] 12/30/2011, 7:12 AM

## 2011-12-31 DIAGNOSIS — K219 Gastro-esophageal reflux disease without esophagitis: Secondary | ICD-10-CM

## 2011-12-31 DIAGNOSIS — I890 Lymphedema, not elsewhere classified: Secondary | ICD-10-CM

## 2011-12-31 DIAGNOSIS — F341 Dysthymic disorder: Secondary | ICD-10-CM

## 2011-12-31 NOTE — Progress Notes (Signed)
Patient ID: Kathy Cooper, female   DOB: 1941/04/24, 71 y.o.   MRN: [id number]  [facility] PROGRESS NOTE  Kathy Cooper [id number]:[id number] DOB: 1940/06/24 DOA: 12/29/2011 PCP: Michiel Sites, MD  Principal Problem:  *RUE Cellulitis  Admitted and placed on empiric Vancomycin and Zosyn. Pt clinically improving   Active Problems:  Chronic acquired lymphedema  Secondary to h/o breast CA with right mastectomy.  Keep right arm elevated.  Hyperlipidemia  Continue Lipitor.  Hypothyroidism  Continue synthroid.  Anxiety and depression  Continue Celexa.  GERD (gastroesophageal reflux disease)  Continue PPI.   Brief narrative:  Kathy Cooper is an 71 y.o. female with a PMH of breast cancer status post mastectomy and lymph node resection. The patient has chronic right upper extremity lymphedema and has had recurrent cellulitis. Admitted 12/29/11 with RUE cellulitis that failed outpatient treatment with Keflex.   Medical Consultants:  None  Other Consultants:  None  Procedures:  None  Antibiotics:  Zosyn 12/29/11--->  Vancomycin 12/29/11--->  Code Status: Full Family Communication: Pt at bedside Disposition Plan: Home when medically stable  HPI/Subjective: No events overnight.   Objective: Filed Vitals:   12/30/11 0529 12/30/11 1408 12/30/11 2100 12/31/11 0542  BP: 137/54 128/55 147/72 118/65  Pulse: 82 69 73 76  Temp: 98.4 F (36.9 C) 97.5 F (36.4 C) 97.9 F (36.6 C) 97.7 F (36.5 C)  TempSrc: Oral Oral Oral Oral  Resp: 17 18 17 18   Height:      Weight:      SpO2: 97% 96% 97% 97%    Intake/Output Summary (Last 24 hours) at 12/31/11 1205 Last data filed at 12/30/11 2305  Gross per 24 hour  Intake    173 ml  Output      0 ml  Net    173 ml    Exam:   General:  Pt is alert, follows commands appropriately, not in acute distress  Cardiovascular: Regular rate and rhythm, S1/S2, no murmurs, no rubs, no gallops  Respiratory: Clear to auscultation  bilaterally, no wheezing, no crackles, no rhonchi  Abdomen: Soft, non tender, non distended, bowel sounds present, no guarding  Extremities: No edema, pulses DP and PT palpable bilaterally  Neuro: Grossly nonfocal  Data Reviewed: Basic Metabolic Panel:  Lab 12/29/11 4010  NA 133*  K 3.9  CL 100  CO2 22  GLUCOSE 116*  BUN 10  CREATININE 0.91  CALCIUM 9.6  MG --  PHOS --   CBC:  Lab 12/30/11 0441 12/29/11 1135  WBC 9.6 11.0*  NEUTROABS -- 9.2*  HGB 12.5 13.5  HCT 37.5 41.1  MCV 88.7 89.3  PLT 187 184   Scheduled Meds:   . atorvastatin  20 mg Oral q1800  . citalopram  20 mg Oral Daily  . enoxaparin (LOVENOX) injection  40 mg Subcutaneous Q24H  . levothyroxine  100 mcg Oral QAC breakfast  . pantoprazole  40 mg Oral Q1200  . piperacillin-tazobactam (ZOSYN)  IV  3.375 g Intravenous Q8H  . sodium chloride  3 mL Intravenous Q12H  . vancomycin  1,500 mg Intravenous Q24H  . vitamin B-12  250 mcg Oral Daily   Continuous Infusions:    Manson Passey, MD  [facility] Pager [phone]  If 7PM-7AM, please contact night-coverage [email or web address] Password [email or web address] 12/31/2011, 12:05 PM   LOS: 2 days

## 2011-12-31 NOTE — Progress Notes (Signed)
Brief Nutrition Note  Patient identified on the Nutrition Risk Report for problems chewing/swallowing foods and/or liquids.   Body mass index is 36.66 kg/(m^2). Pt meets criteria for class II obesity based on current BMI.   Current diet order is regular, patient is consuming approximately 100% of meals at this time. Labs and medications reviewed.   Pt reports recently getting a new partial which has been working very well and denies any problems chewing or swallowing. Pt states sometimes she gets tense and grinds her teeth but this has not altered her nutrition. Pt reports stable weight and good appetite.    No further nutrition interventions warranted at this time. If additional nutrition issues arise, please re-consult RD.   Dietitian# [phone]

## 2012-01-01 LAB — CREATININE, SERUM
Creatinine, Ser: 1.16 mg/dL — ABNORMAL HIGH (ref 0.50–1.10)
GFR calc Af Amer: 54 mL/min — ABNORMAL LOW (ref >90)
GFR calc non Af Amer: 47 mL/min — ABNORMAL LOW (ref >90)

## 2012-01-01 MED ORDER — DOXYCYCLINE HYCLATE 100 MG PO TABS: 100.0000 mg | ORAL_TABLET | Freq: Two times a day (BID) | ORAL | Status: AC

## 2012-01-01 MED ORDER — OXYCODONE HCL 5 MG PO TABS: 5.0000 mg | ORAL_TABLET | ORAL | Status: AC | PRN

## 2012-01-01 MED ORDER — ALBUTEROL SULFATE HFA 108 (90 BASE) MCG/ACT IN AERS: 2.0000 | INHALATION_SPRAY | Freq: Four times a day (QID) | RESPIRATORY_TRACT | Status: AC | PRN

## 2012-01-01 MED ORDER — ONDANSETRON HCL 4 MG PO TABS: 4.0000 mg | ORAL_TABLET | Freq: Four times a day (QID) | ORAL | Status: AC | PRN

## 2012-01-01 MED ORDER — DIPHENHYDRAMINE HCL 25 MG PO CAPS: 25.0000 mg | ORAL_CAPSULE | Freq: Four times a day (QID) | ORAL | Status: DC | PRN

## 2012-01-01 MED ORDER — SULFAMETHOXAZOLE-TRIMETHOPRIM 800-160 MG PO TABS: 1.0000 | ORAL_TABLET | Freq: Two times a day (BID) | ORAL | Status: DC

## 2012-01-01 MED ORDER — ZOLPIDEM TARTRATE 10 MG PO TABS: 10.0000 mg | ORAL_TABLET | Freq: Every evening | ORAL | Status: DC | PRN

## 2012-01-01 NOTE — Progress Notes (Addendum)
Patient discharged home, all discharge medications and instructions reviewed and questions answered.  Patient states wants to ambulate to vehicle.

## 2012-01-01 NOTE — Discharge Summary (Addendum)
Physician Discharge Summary  TOPEKA GIAMMONA ZOX:[id number] DOB: [birth year] DOA: 12/29/2011  PCP: Michiel Sites, MD  Admit date: 12/29/2011 Discharge date: 01/01/2012  Recommendations for Outpatient Follow-up:  1. Pt was discharged in stable condition and was advised to follow up with her PCP in 2 weeks post discharge 2. She was provided prescription for Doxycycline BID to take for 10 additional days 3. Pt was educated on importance of medical compliance and importance of follow up with PCP, she verbalized understanding and will schedule the appointment at her convenience  4. This discharge summary will be routed to PCP today 5. Please check BMP - one value of creatinine during the hospital stay was slightly above her normal so needs to be followed  Discharge Diagnoses: Right Upper extremity cellulitis  Principal Problem:  *RUE Cellulitis Active Problems:  Chronic acquired lymphedema  Hyperlipidemia  Hypothyroidism  Anxiety and depression  GERD (gastroesophageal reflux disease)  Discharge Condition: Stable  Diet recommendation: Regular diet discussed with importance of hydration   History of present illness:  Kathy Cooper is an 71 y.o. female with a PMH of breast cancer status post mastectomy and lymph node resection. The patient has chronic right upper extremity lymphedema and has had recurrent cellulitis. Admitted 12/29/11 with RUE cellulitis that failed outpatient treatment with Keflex.  Hospital Course:  Principal Problem:  *RUE Cellulitis  Admitted and placed on empiric Vancomycin and Zosyn 12/29/2011 and on discharge day was switched oral antibiotics, doxycycline 100 mg  BID for 10 days I have emphasized the importance of follow up with PCP to evaluate progression of cellulitis treatment   Active Problems:  Chronic acquired lymphedema  Secondary to h/o breast CA with right mastectomy.  Keep right arm elevated.  Hyperlipidemia  Continued Lipitor as this remains  stable medical issue  Hypothyroidism  Continued synthroid. Stable during the hospital stay   Anxiety and depression  Continued Celexa. This has remained stable during the hospital stay   GERD (gastroesophageal reflux disease)  Continued PPI.   Procedures:  None  Consultations:  None  Antibiotics:  Vancomycin 12/29/2011 --> 07/25  Zosyn 12/29/2011 --> 07/25  Doxycycline on discharge 01/01/2012 --> for 10 days   Discharge Exam: Filed Vitals:   01/01/12 0500  BP: 128/73  Pulse: 63  Temp: 98 F (36.7 C)  Resp: 16   Filed Vitals:   12/31/11 0542 12/31/11 1345 12/31/11 2035 01/01/12 0500  BP: 118/65 130/75 116/69 128/73  Pulse: 76 67 75 63  Temp: 97.7 F (36.5 C) 97.4 F (36.3 C) 98 F (36.7 C) 98 F (36.7 C)  TempSrc: Oral Oral Oral Oral  Resp: 18 18 17 16   Height:      Weight:      SpO2: 97% 97% 97% 95%    General: Pt is alert, follows commands appropriately, not in acute distress Cardiovascular: Regular rate and rhythm, S1/S2 +, no murmurs, no rubs, no gallops Respiratory: Clear to auscultation bilaterally, no wheezing, no crackles, no rhonchi Abdominal: Soft, non tender, non distended, bowel sounds +, no guarding Extremities: right upper extremity edema improving, no cyanosis, pulses palpable bilaterally DP and PT Neuro: Grossly nonfocal  Discharge Instructions  Discharge Orders    Future Orders Please Complete By Expires   Diet - low sodium heart healthy      Diet - low sodium heart healthy      Increase activity slowly      Increase activity slowly        Medication List  As  of 01/01/2012 11:37 AM   STOP taking these medications         cephALEXin 500 MG capsule         TAKE these medications         albuterol 108 (90 BASE) MCG/ACT inhaler   Commonly known as: PROVENTIL HFA;VENTOLIN HFA   Inhale 2 puffs into the lungs every 6 (six) hours as needed for wheezing or shortness of breath. For shortness of breath      atorvastatin 20 MG  tablet   Commonly known as: LIPITOR   Take 20 mg by mouth daily.      B-12 PO   Take 0.5 tablets by mouth daily.      citalopram 20 MG tablet   Commonly known as: CELEXA   Take 20 mg by mouth daily.      diphenhydrAMINE 25 mg capsule   Commonly known as: BENADRYL   Take 1 capsule (25 mg total) by mouth every 6 (six) hours as needed for itching (25-50 mg).      levothyroxine 100 MCG tablet   Commonly known as: SYNTHROID, LEVOTHROID   Take 100 mcg by mouth daily.      omeprazole 20 MG capsule   Commonly known as: PRILOSEC   Take 20 mg by mouth daily.      ondansetron 4 MG tablet   Commonly known as: ZOFRAN   Take 1 tablet (4 mg total) by mouth every 6 (six) hours as needed for nausea.      oxyCODONE 5 MG immediate release tablet   Commonly known as: Oxy IR/ROXICODONE   Take 1 tablet (5 mg total) by mouth every 4 (four) hours as needed for pain.      phentermine 37.5 MG capsule   Take 37.5 mg by mouth every morning.      sulfamethoxazole-trimethoprim 800-160 MG per tablet   Commonly known as: BACTRIM DS,SEPTRA DS   Take 1 tablet by mouth 2 (two) times daily.      zolpidem 10 MG tablet   Commonly known as: AMBIEN   Take 1 tablet (10 mg total) by mouth at bedtime as needed for sleep. For insomnia           Follow-up Information    Follow up with Michiel Sites, MD. Schedule an appointment as soon as possible for a visit in 2 weeks.   Contact information:   [address] [place] [address] [phone]          The results of significant diagnostics from this hospitalization (including imaging, microbiology, ancillary and laboratory) are listed below for reference.    Significant Diagnostic Studies: No results found.  Microbiology: No results found for this or any previous visit (from the past 240 hour(s)).   Labs: Basic Metabolic Panel:  Lab 01/01/12 9147 12/29/11 1135  NA -- 133*  K -- 3.9  CL -- 100  CO2 -- 22    GLUCOSE -- 116*  BUN -- 10  CREATININE 1.16* 0.91  CALCIUM -- 9.6  MG -- --  PHOS -- --   CBC:  Lab 12/30/11 0441 12/29/11 1135  WBC 9.6 11.0*  HGB 12.5 13.5  HCT 37.5 41.1  MCV 88.7 89.3  PLT 187 184    Time coordinating discharge: Over 30 minutes  Signed:  Manson Passey, MD  [facility] 01/01/2012, 11:37 AM  Pager #: [phone]

## 2012-01-17 NOTE — ED Provider Notes (Signed)
Medical screening examination/treatment/procedure(s) were performed by non-physician practitioner and as supervising physician I was immediately available for consultation/collaboration.   Suzi Roots, MD 01/17/12 [phone]

## 2012-03-13 ENCOUNTER — Encounter (HOSPITAL_COMMUNITY): Payer: Self-pay | Admitting: Emergency Medicine

## 2012-03-13 ENCOUNTER — Inpatient Hospital Stay (HOSPITAL_COMMUNITY)
Admission: EM | Admit: 2012-03-13 | Discharge: 2012-03-16 | DRG: 603 | Disposition: A | Discharge status: Home / Self Care | Payer: Medicare Other | Attending: Family Medicine | Admitting: Family Medicine

## 2012-03-13 DIAGNOSIS — Z79899 Other long term (current) drug therapy: Secondary | ICD-10-CM

## 2012-03-13 DIAGNOSIS — F341 Dysthymic disorder: Secondary | ICD-10-CM

## 2012-03-13 DIAGNOSIS — I890 Lymphedema, not elsewhere classified: Secondary | ICD-10-CM

## 2012-03-13 DIAGNOSIS — IMO0002 Reserved for concepts with insufficient information to code with codable children: Principal | ICD-10-CM

## 2012-03-13 DIAGNOSIS — K219 Gastro-esophageal reflux disease without esophagitis: Secondary | ICD-10-CM

## 2012-03-13 DIAGNOSIS — E785 Hyperlipidemia, unspecified: Secondary | ICD-10-CM

## 2012-03-13 DIAGNOSIS — F32A Depression, unspecified: Secondary | ICD-10-CM

## 2012-03-13 DIAGNOSIS — Z23 Encounter for immunization: Secondary | ICD-10-CM

## 2012-03-13 DIAGNOSIS — F329 Major depressive disorder, single episode, unspecified: Secondary | ICD-10-CM

## 2012-03-13 DIAGNOSIS — J45909 Unspecified asthma, uncomplicated: Secondary | ICD-10-CM | POA: Diagnosis present

## 2012-03-13 DIAGNOSIS — Z9010 Acquired absence of unspecified breast and nipple: Secondary | ICD-10-CM

## 2012-03-13 DIAGNOSIS — K2270 Barrett's esophagus without dysplasia: Secondary | ICD-10-CM | POA: Diagnosis present

## 2012-03-13 DIAGNOSIS — IMO0001 Reserved for inherently not codable concepts without codable children: Secondary | ICD-10-CM | POA: Diagnosis present

## 2012-03-13 DIAGNOSIS — L0390 Cellulitis, unspecified: Secondary | ICD-10-CM | POA: Diagnosis present

## 2012-03-13 DIAGNOSIS — Z853 Personal history of malignant neoplasm of breast: Secondary | ICD-10-CM

## 2012-03-13 DIAGNOSIS — E039 Hypothyroidism, unspecified: Secondary | ICD-10-CM

## 2012-03-13 DIAGNOSIS — R6889 Other general symptoms and signs: Secondary | ICD-10-CM

## 2012-03-13 DIAGNOSIS — L03113 Cellulitis of right upper limb: Secondary | ICD-10-CM

## 2012-03-13 DIAGNOSIS — F419 Anxiety disorder, unspecified: Secondary | ICD-10-CM

## 2012-03-13 DIAGNOSIS — F3289 Other specified depressive episodes: Secondary | ICD-10-CM | POA: Diagnosis present

## 2012-03-13 DIAGNOSIS — F411 Generalized anxiety disorder: Secondary | ICD-10-CM | POA: Diagnosis present

## 2012-03-13 LAB — URINALYSIS, ROUTINE W REFLEX MICROSCOPIC
Glucose, UA: NEGATIVE mg/dL
Hgb urine dipstick: NEGATIVE
Specific Gravity, Urine: 1.037 — ABNORMAL HIGH (ref 1.005–1.030)

## 2012-03-13 LAB — COMPREHENSIVE METABOLIC PANEL
ALT: 19 U/L (ref 0–35)
AST: 18 U/L (ref 0–37)
Albumin: 3.5 g/dL (ref 3.5–5.2)
CO2: 23 mEq/L (ref 19–32)
Chloride: 99 mEq/L (ref 96–112)
Creatinine, Ser: 0.86 mg/dL (ref 0.50–1.10)
GFR calc non Af Amer: 67 mL/min — ABNORMAL LOW (ref >90)
Sodium: 133 mEq/L — ABNORMAL LOW (ref 135–145)
Total Bilirubin: 0.6 mg/dL (ref 0.3–1.2)

## 2012-03-13 LAB — CBC
HCT: 36.1 % (ref 36.0–46.0)
MCH: 30 pg (ref 26.0–34.0)
MCHC: 34.3 g/dL (ref 30.0–36.0)
RDW: 13.5 % (ref 11.5–15.5)

## 2012-03-13 LAB — CREATININE, SERUM
Creatinine, Ser: 0.9 mg/dL (ref 0.50–1.10)
GFR calc non Af Amer: 64 mL/min — ABNORMAL LOW (ref >90)

## 2012-03-13 LAB — CBC WITH DIFFERENTIAL/PLATELET
Basophils Absolute: 0 10*3/uL (ref 0.0–0.1)
Basophils Relative: 0 % (ref 0–1)
Lymphocytes Relative: 9 % — ABNORMAL LOW (ref 12–46)
MCHC: 34.2 g/dL (ref 30.0–36.0)
Monocytes Absolute: 0.4 10*3/uL (ref 0.1–1.0)
Neutro Abs: 10.5 10*3/uL — ABNORMAL HIGH (ref 1.7–7.7)
Neutrophils Relative %: 87 % — ABNORMAL HIGH (ref 43–77)
Platelets: 185 10*3/uL (ref 150–400)
RDW: 13.4 % (ref 11.5–15.5)
WBC: 12 10*3/uL — ABNORMAL HIGH (ref 4.0–10.5)

## 2012-03-13 MED ORDER — ONDANSETRON HCL 4 MG/2ML IJ SOLN: 4.0000 mg | Freq: Four times a day (QID) | INTRAMUSCULAR | Status: DC | PRN

## 2012-03-13 MED ORDER — INFLUENZA VIRUS VACC SPLIT PF IM SUSP
0.5000 mL | INTRAMUSCULAR | Status: AC
  Administered 2012-03-14: 0.5 mL via INTRAMUSCULAR
  Filled 2012-03-13: qty 0.5

## 2012-03-13 MED ORDER — ATORVASTATIN CALCIUM 20 MG PO TABS
20.0000 mg | ORAL_TABLET | Freq: Every day | ORAL | Status: DC
  Administered 2012-03-13 – 2012-03-16 (×4): 20 mg via ORAL
  Filled 2012-03-13 (×4): qty 1

## 2012-03-13 MED ORDER — VANCOMYCIN HCL IN DEXTROSE 1-5 GM/200ML-% IV SOLN
1000.0000 mg | Freq: Two times a day (BID) | INTRAVENOUS | Status: DC
  Administered 2012-03-13 – 2012-03-15 (×4): 1000 mg via INTRAVENOUS
  Filled 2012-03-13 (×5): qty 200

## 2012-03-13 MED ORDER — SODIUM CHLORIDE 0.9 % IJ SOLN
3.0000 mL | Freq: Two times a day (BID) | INTRAMUSCULAR | Status: DC
  Administered 2012-03-16: 3 mL via INTRAVENOUS

## 2012-03-13 MED ORDER — DOCUSATE SODIUM 100 MG PO CAPS
100.0000 mg | ORAL_CAPSULE | Freq: Two times a day (BID) | ORAL | Status: DC
  Administered 2012-03-14 – 2012-03-16 (×4): 100 mg via ORAL
  Filled 2012-03-13 (×8): qty 1

## 2012-03-13 MED ORDER — ALBUTEROL SULFATE HFA 108 (90 BASE) MCG/ACT IN AERS
2.0000 | INHALATION_SPRAY | Freq: Four times a day (QID) | RESPIRATORY_TRACT | Status: DC | PRN
  Filled 2012-03-13: qty 6.7

## 2012-03-13 MED ORDER — ENOXAPARIN SODIUM 40 MG/0.4ML ~~LOC~~ SOLN
40.0000 mg | SUBCUTANEOUS | Status: DC
  Administered 2012-03-13 – 2012-03-16 (×4): 40 mg via SUBCUTANEOUS
  Filled 2012-03-13 (×4): qty 0.4

## 2012-03-13 MED ORDER — ZOLPIDEM TARTRATE 5 MG PO TABS
5.0000 mg | ORAL_TABLET | Freq: Every evening | ORAL | Status: DC | PRN
  Administered 2012-03-13 – 2012-03-15 (×3): 5 mg via ORAL
  Filled 2012-03-13 (×3): qty 1

## 2012-03-13 MED ORDER — FENTANYL CITRATE 0.05 MG/ML IJ SOLN
100.0000 ug | Freq: Once | INTRAMUSCULAR | Status: AC
  Administered 2012-03-13: 100 ug via INTRAVENOUS
  Filled 2012-03-13: qty 2

## 2012-03-13 MED ORDER — SODIUM CHLORIDE 0.9 % IV SOLN
250.0000 mL | INTRAVENOUS | Status: DC | PRN
  Administered 2012-03-15: 250 mL via INTRAVENOUS

## 2012-03-13 MED ORDER — LEVOTHYROXINE SODIUM 100 MCG PO TABS
100.0000 ug | ORAL_TABLET | Freq: Every day | ORAL | Status: DC
  Administered 2012-03-13 – 2012-03-16 (×4): 100 ug via ORAL
  Filled 2012-03-13 (×4): qty 1

## 2012-03-13 MED ORDER — ONDANSETRON HCL 4 MG PO TABS: 4.0000 mg | ORAL_TABLET | Freq: Four times a day (QID) | ORAL | Status: DC | PRN

## 2012-03-13 MED ORDER — OXYCODONE HCL 5 MG PO TABS
5.0000 mg | ORAL_TABLET | Freq: Four times a day (QID) | ORAL | Status: DC | PRN
  Administered 2012-03-13 – 2012-03-14 (×3): 5 mg via ORAL
  Filled 2012-03-13 (×3): qty 1

## 2012-03-13 MED ORDER — GI COCKTAIL ~~LOC~~
30.0000 mL | Freq: Once | ORAL | Status: AC
  Administered 2012-03-13: 30 mL via ORAL
  Filled 2012-03-13: qty 30

## 2012-03-13 MED ORDER — CITALOPRAM HYDROBROMIDE 20 MG PO TABS
20.0000 mg | ORAL_TABLET | Freq: Every day | ORAL | Status: DC
  Administered 2012-03-13 – 2012-03-16 (×4): 20 mg via ORAL
  Filled 2012-03-13 (×4): qty 1

## 2012-03-13 MED ORDER — VANCOMYCIN HCL IN DEXTROSE 1-5 GM/200ML-% IV SOLN
1000.0000 mg | Freq: Once | INTRAVENOUS | Status: AC
  Administered 2012-03-13: 1000 mg via INTRAVENOUS
  Filled 2012-03-13: qty 200

## 2012-03-13 MED ORDER — SODIUM CHLORIDE 0.9 % IJ SOLN: 3.0000 mL | INTRAMUSCULAR | Status: DC | PRN

## 2012-03-13 MED ORDER — PANTOPRAZOLE SODIUM 40 MG PO TBEC
40.0000 mg | DELAYED_RELEASE_TABLET | Freq: Every day | ORAL | Status: DC
  Administered 2012-03-13 – 2012-03-16 (×4): 40 mg via ORAL
  Filled 2012-03-13 (×4): qty 1

## 2012-03-13 MED ORDER — ONDANSETRON HCL 4 MG/2ML IJ SOLN
4.0000 mg | Freq: Once | INTRAMUSCULAR | Status: AC
  Administered 2012-03-13: 4 mg via INTRAVENOUS
  Filled 2012-03-13: qty 2

## 2012-03-13 MED ORDER — MORPHINE SULFATE 2 MG/ML IJ SOLN
2.0000 mg | INTRAMUSCULAR | Status: DC | PRN
  Administered 2012-03-13 (×2): 2 mg via INTRAVENOUS
  Filled 2012-03-13 (×2): qty 1

## 2012-03-13 NOTE — ED Notes (Signed)
Pt states that her right arm started itching this am then it started turning bright red and now has moved up to upper arm arm,  Pt states she has history of cellulitis and was hospitalized at [facility] last October and [facility] in March for cellulitis.  Pt's arm is swollen,  Red and warm to touch,  Pt states "just don't feel good"

## 2012-03-13 NOTE — Progress Notes (Signed)
ANTIBIOTIC CONSULT NOTE - INITIAL  Pharmacy Consult for vancomycin Indication: cellulitis  No Known Allergies  Patient Measurements: Height: 5\' 1"  (154.9 cm) Weight: 194 lb 8 oz (88.225 kg) IBW/kg (Calculated) : 47.8  Adjusted Body Weight:   Vital Signs: Temp: 98.4 F (36.9 C) (10/05 0134) Temp src: Oral (10/05 0134) BP: 140/61 mmHg (10/05 0134) Pulse Rate: 97  (10/05 0134) Intake/Output from previous day:   Intake/Output from this shift:    Labs:  Basename 03/13/12 0350  WBC 12.0*  HGB 13.4  PLT 185  LABCREA --  CREATININE 0.86   Estimated Creatinine Clearance: 62.4 ml/min (by C-G formula based on Cr of 0.86). No results found for this basename: VANCOTROUGH:2,VANCOPEAK:2,VANCORANDOM:2,GENTTROUGH:2,GENTPEAK:2,GENTRANDOM:2,TOBRATROUGH:2,TOBRAPEAK:2,TOBRARND:2,AMIKACINPEAK:2,AMIKACINTROU:2,AMIKACIN:2, in the last 72 hours   Microbiology: No results found for this or any previous visit (from the past 720 hour(s)).  Medical History: Past Medical History  Diagnosis Date  . Anxiety   . Breast cancer   . Psoriasis   . Fibroids   . Asthma   . Barrett's esophagus   . Colon polyps   . Diverticulosis   . Fibromyalgia   . Hyperlipemia   . Grave's disease   . Obesity   . Pneumonia   . Cellulitis   . Cataracts, bilateral     Medications:  Anti-infectives     Start     Dose/Rate Route Frequency Ordered Stop   03/13/12 1800   vancomycin (VANCOCIN) IVPB 1000 mg/200 mL premix        1,000 mg 200 mL/hr over 60 Minutes Intravenous Every 12 hours 03/13/12 0638     03/13/12 0445   vancomycin (VANCOCIN) IVPB 1000 mg/200 mL premix        1,000 mg 200 mL/hr over 60 Minutes Intravenous  Once 03/13/12 0437           Assessment: Patient with cellulitis.  First dose of antibiotics already given in ED.  Goal of Therapy:  Vancomycin trough level 10-15 mcg/ml  Plan:  Measure antibiotic drug levels at steady state Follow up culture results vancomycin 1gm iv  q12hr  Darlina Guys, Jacquenette Shone Crowford 03/13/2012,6:38 AM

## 2012-03-13 NOTE — Progress Notes (Signed)
UR completed 

## 2012-03-13 NOTE — H&P (Signed)
[facility] History and Physical  Kathy Cooper ZOX:[id number] DOB: 1940/08/30    PCP:   Michiel Sites, MD   Chief Complaint: Redness of the right upper extremity.  HPI: Kathy Cooper is an 71 y.o. 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  She had it started at the forearm and within one day, she had it spread to the entire arm.  She has low grade subjective fever but no chills.  No other symptomology reported.  This is the fourth time she had this problem.  She had used different stocking and wrap in the past, but she said it was too painful because she has fibromyalgia as well.  Evaluation in the ER included a leukocytosis with WBC of 12K., normal renal fx, and BS of 116.  Hospitalist was asked to admit her for cellulitis.  Rewiew of Systems:  Constitutional: Negative for malaise,  and chills. No significant weight loss or weight gain Eyes: Negative for eye pain, redness and discharge, diplopia, visual changes, or flashes of light. ENMT: Negative for ear pain, hoarseness, nasal congestion, sinus pressure and sore throat. No headaches; tinnitus, drooling, or problem swallowing. Cardiovascular: Negative for chest pain, palpitations, diaphoresis, dyspnea and peripheral edema. ; No orthopnea, PND Respiratory: Negative for cough, hemoptysis, wheezing and stridor. No pleuritic chestpain. Gastrointestinal: Negative for nausea, vomiting, diarrhea, constipation, abdominal pain, melena, blood in stool, hematemesis, jaundice and rectal bleeding.    Genitourinary: Negative for frequency, dysuria, incontinence,flank pain and hematuria; Musculoskeletal: Negative for back pain and neck pain. Negative for swelling and trauma.;  Skin: . Negative for pruritus, rash, abrasions, bruising and skin  lesion.; ulcerations Neuro: Negative for headache, lightheadedness and neck stiffness. Negative for weakness, altered level of consciousness , altered mental status, extremity weakness, burning feet, involuntary movement, seizure and syncope.  Psych: negative for anxiety, depression, insomnia, tearfulness, panic attacks, hallucinations, paranoia, suicidal or homicidal ideation    Past Medical History  Diagnosis Date  . Anxiety   . Breast cancer   . Psoriasis   . Fibroids   . Asthma   . Barrett's esophagus   . Colon polyps   . Diverticulosis   . Fibromyalgia   . Hyperlipemia   . Grave's disease   . Obesity   . Pneumonia   . Cellulitis   . Cataracts, bilateral     Past Surgical History  Procedure Date  . Thyroidectomy   . Mastectomy   . Appendectomy   . Rotator cuff repair 2009    right arm   . Breast surgery     R mastecomty, L reduction    Medications:  HOME MEDS: Prior to Admission medications   Medication Sig Start Date End Date Taking? Authorizing Provider  atorvastatin (LIPITOR) 20 MG tablet Take 20 mg by mouth daily.   Yes Historical Provider, MD  citalopram (CELEXA) 20 MG tablet Take 20 mg by mouth daily.     Yes Historical Provider, MD  levothyroxine (SYNTHROID, LEVOTHROID) 100 MCG tablet Take 100 mcg by mouth daily.     Yes Historical Provider, MD  omeprazole (PRILOSEC) 20 MG capsule Take 20 mg by mouth daily.     Yes Historical Provider, MD  phentermine 37.5 MG capsule Take 37.5 mg by mouth every morning.     Yes Historical Provider, MD  zolpidem (AMBIEN) 10 MG  tablet Take 5 mg by mouth at bedtime as needed.   Yes Historical Provider, MD  albuterol (PROVENTIL HFA;VENTOLIN HFA) 108 (90 BASE) MCG/ACT inhaler Inhale 2 puffs into the lungs every 6 (six) hours as needed for wheezing or shortness of breath. For shortness of breath 01/01/12   Dorothea Ogle, MD     Allergies:  No Known Allergies  Social History:   reports that she quit smoking about 17 years ago.  She quit smokeless tobacco use about 16 years ago. She reports that she does not drink alcohol or use illicit drugs.  Family History: Family History  Problem Relation Age of Onset  . Diabetes Brother   . Heart disease Mother   . Kidney disease Brother   . Asthma Mother   . Stroke Father   . Heart failure Sister   .        Physical Exam: Filed Vitals:   03/13/12 0134  BP: 140/61  Pulse: 97  Temp: 98.4 F (36.9 C)  TempSrc: Oral  Resp: 20  SpO2: 100%   Blood pressure 140/61, pulse 97, temperature 98.4 F (36.9 C), temperature source Oral, resp. rate 20, SpO2 100.00%.  GEN:  Pleasant patient lying in the stretcher in no acute distress; cooperative with exam. PSYCH:  alert and oriented x4; does not appear anxious or depressed; affect is appropriate. HEENT: Mucous membranes pink and anicteric; PERRLA; EOM intact; no cervical lymphadenopathy nor thyromegaly or carotid bruit; no JVD; There were no stridor. Neck is very supple. Breasts:: Not examined CHEST WALL: No tenderness CHEST: Normal respiration, clear to auscultation bilaterally.  HEART: Regular rate and rhythm.  There are no murmur, rub, or gallops.   BACK: No kyphosis or scoliosis; no CVA tenderness ABDOMEN: soft and non-tender; no masses, no organomegaly, normal abdominal bowel sounds; no pannus; no intertriginous candida. There is no rebound and no distention. Rectal Exam: Not done EXTREMITIES: No bone or joint deformity; age-appropriate arthropathy of the hands and knees; no edema; no ulcerations.  There is no calf tenderness. Genitalia: not examined PULSES: 2+ and symmetric SKIN: She has tense erythema of the right extremity, but in the fold space, it is clear. It seems to be in the dependent area only. CNS: Cranial nerves 2-12 grossly intact no focal lateralizing neurologic deficit.  Speech is fluent; uvula elevated with phonation, facial symmetry and tongue midline. DTR are normal bilaterally, cerebella exam is  intact, barbinski is negative and strengths are equaled bilaterally.  No sensory loss.   Labs on Admission:  Basic Metabolic Panel:  Lab 03/13/12 4098  NA 133*  K 3.5  CL 99  CO2 23  GLUCOSE 116*  BUN 12  CREATININE 0.86  CALCIUM 9.3  MG --  PHOS --   Liver Function Tests:  Lab 03/13/12 0350  AST 18  ALT 19  ALKPHOS 53  BILITOT 0.6  PROT 6.7  ALBUMIN 3.5   No results found for this basename: LIPASE:5,AMYLASE:5 in the last 168 hours No results found for this basename: AMMONIA:5 in the last 168 hours CBC:  Lab 03/13/12 0350  WBC 12.0*  NEUTROABS 10.5*  HGB 13.4  HCT 39.2  MCV 87.1  PLT 185   Cardiac Enzymes: No results found for this basename: CKTOTAL:5,CKMB:5,CKMBINDEX:5,TROPONINI:5 in the last 168 hours  CBG: No results found for this basename: GLUCAP:5 in the last 168 hours   Radiological Exams on Admission: No results found.     Assessment/Plan Present on Admission:  .RUE Cellulitis .Chronic acquired lymphedema .Hyperlipidemia .  Hypothyroidism .Anxiety and depression   PLAN:  I am not absolutely sure this is cellulitis, but it is close enough.  Will admit her for IV antibiotic.  She was given Zenaida Niece and Zosyn last time followed by Doxy outpatient, I think Vancomycin alone is adequate.  Will continue her on her meds.  I will check her TSH as well.  She is stable, full code, and will be admitted to [facility] service.  If there is a lymphadema clinic, she may find it beneficial.    Other plans as per orders.  Code Status: FULL Unk Lightning, MD. [facility] Pager [phone] 7pm to 7am.  03/13/2012, 5:35 AM

## 2012-03-13 NOTE — Progress Notes (Signed)
Patient relates relief of chest pain after GI cocktail.  Encouraged patient if pain returns to please notify nurse immediately.

## 2012-03-13 NOTE — Progress Notes (Signed)
1:08 PM I agree with HPI/GPe and A/P per Dr. Houston Siren   Patient had a mastectomy in 1996 by Dr. Luretha Murphy and her oncologist was Dr. Arline Asp up until 2008.  Patient was on tamoxifen and this was subsequently discontinued. She had 2 lymph nodes that were positive for cancer and 8 lymph nodes were removed from the right axilla. She has had a reduction mastectomy on the left side as well. States that things are better,     BP 124/53  Pulse 81  Temp 98.1 F (36.7 C) (Oral)  Resp 20  Ht 5\' 1"  (1.549 m)  Wt 88.225 kg (194 lb 8 oz)  BMI 36.75 kg/m2  SpO2 99%  Chart review   Admission 12/29/11 for failed outpatient cellulitis Rx  Admission 03/14/11 for RUE cellulitis  Seen by Dr. Drue Flirt GI for Barretts esophagus-last office visit 11/26/10-colonoscopy showed 2 tubular adenoma polyps/EGD=barrett's (egd in 2005), Colonoscopy 2003 by Dr. Thana Farr polyps  Admission 05/31/2008 for RUE cellulitis  Patient Active Problem List  Diagnosis  . RUE Cellulitis  . Chronic acquired lymphedema  . Hyperlipidemia  . Hypothyroidism  . Anxiety and depression  . GERD (gastroesophageal reflux disease)   Agree with plan of care as per Dr. Conley Rolls Will need elevation of the right upper extremity, wrapped the arm in order to improve drainage, continue vancomycin empirically and reassess tomorrow  Pleas Koch, MD [facility] (P) [phone]

## 2012-03-13 NOTE — ED Notes (Signed)
Pt states she has cellulitis in her right arm  Pt's right arm is red, swollen, and hot to touch  Pt states her sxs started today

## 2012-03-13 NOTE — ED Provider Notes (Signed)
History     CSN: [id number]  Arrival date & time 03/13/12  0131   First MD Initiated Contact with Patient 03/13/12 0403      Chief Complaint  Patient presents with  . Cellulitis    (Consider location/radiation/quality/duration/timing/severity/associated sxs/prior treatment) HPI This is a 71 year old white female with a history of right mastectomy and right upper extremity lymphedema. She's had multiple episodes of cellulitis in the right upper extremity. Yesterday morning she woke up with general malaise. About 5 PM she noticed erythema and pain in her right forearm which is now extended to include her entire right upper extremity. She is having moderate pain with this, worse with movement or palpation. Her symptoms are consistent with previous cellulitis. She states she's had low-grade fever. She's had some nausea but no vomiting. She also complains of pain in her legs.  Past Medical History  Diagnosis Date  . Anxiety   . Breast cancer   . Psoriasis   . Fibroids   . Asthma   . Barrett's esophagus   . Colon polyps   . Diverticulosis   . Fibromyalgia   . Hyperlipemia   . Grave's disease   . Obesity   . Pneumonia   . Cellulitis   . Cataracts, bilateral     Past Surgical History  Procedure Date  . Thyroidectomy   . Mastectomy   . Appendectomy   . Rotator cuff repair 2009    right arm   . Breast surgery     R mastecomty, L reduction    Family History  Problem Relation Age of Onset  . Diabetes Brother   . Heart disease Mother   . Kidney disease Brother   . Asthma Mother   . Stroke Father   . Heart failure Sister   .       History  Substance Use Topics  . Smoking status: Former Smoker -- 25 years    Quit date: 12/29/1994  . Smokeless tobacco: Former Neurosurgeon    Quit date: 12/29/1995  . Alcohol Use: No    OB History    Grav Para Term Preterm Abortions TAB SAB Ect Mult Living                  Review of Systems  All other systems reviewed and are  negative.    Allergies  Review of patient's allergies indicates no known allergies.  Home Medications   Current Outpatient Rx  Name Route Sig Dispense Refill  . ATORVASTATIN CALCIUM 20 MG PO TABS Oral Take 20 mg by mouth daily.    Marland Kitchen CITALOPRAM HYDROBROMIDE 20 MG PO TABS Oral Take 20 mg by mouth daily.      Marland Kitchen LEVOTHYROXINE SODIUM 100 MCG PO TABS Oral Take 100 mcg by mouth daily.      Marland Kitchen OMEPRAZOLE 20 MG PO CPDR Oral Take 20 mg by mouth daily.      Marland Kitchen PHENTERMINE HCL 37.5 MG PO CAPS Oral Take 37.5 mg by mouth every morning.      Marland Kitchen ZOLPIDEM TARTRATE 10 MG PO TABS Oral Take 5 mg by mouth at bedtime as needed.    . ALBUTEROL SULFATE HFA 108 (90 BASE) MCG/ACT IN AERS Inhalation Inhale 2 puffs into the lungs every 6 (six) hours as needed for wheezing or shortness of breath. For shortness of breath 1 Inhaler 3    BP 140/61  Pulse 97  Temp 98.4 F (36.9 C) (Oral)  Resp 20  SpO2 100%  Physical Exam  General: Well-developed, well-nourished female in no acute distress; appearance consistent with age of record HENT: normocephalic, atraumatic Eyes: pupils equal round and reactive to light; extraocular muscles intact Neck: supple Heart: regular rate and rhythm Lungs: clear to auscultation bilaterally Abdomen: soft; nondistended; nontender; bowel sounds present Extremities: No deformity; full range of motion; edema, tenderness, erythema and warmth of right forearm and upper arm Neurologic: Awake, alert and oriented; motor function intact in all extremities and symmetric; no facial droop Skin: Warm and dry; changes to right upper extremity as noted Psychiatric: Normal mood and affect    ED Course  Procedures (including critical care time)    MDM   Nursing notes and vitals signs, including pulse oximetry, reviewed.  Summary of this visit's results, reviewed by myself:  Labs:  Results for orders placed during the hospital encounter of 03/13/12  CBC WITH DIFFERENTIAL      Component  Value Range   WBC 12.0 (*) 4.0 - 10.5 K/uL   RBC 4.50  3.87 - 5.11 MIL/uL   Hemoglobin 13.4  12.0 - 15.0 g/dL   HCT 16.1  09.6 - 04.5 %   MCV 87.1  78.0 - 100.0 fL   MCH 29.8  26.0 - 34.0 pg   MCHC 34.2  30.0 - 36.0 g/dL   RDW 40.9  81.1 - 91.4 %   Platelets 185  150 - 400 K/uL   Neutrophils Relative 87 (*) 43 - 77 %   Neutro Abs 10.5 (*) 1.7 - 7.7 K/uL   Lymphocytes Relative 9 (*) 12 - 46 %   Lymphs Abs 1.1  0.7 - 4.0 K/uL   Monocytes Relative 3  3 - 12 %   Monocytes Absolute 0.4  0.1 - 1.0 K/uL   Eosinophils Relative 0  0 - 5 %   Eosinophils Absolute 0.1  0.0 - 0.7 K/uL   Basophils Relative 0  0 - 1 %   Basophils Absolute 0.0  0.0 - 0.1 K/uL  COMPREHENSIVE METABOLIC PANEL      Component Value Range   Sodium 133 (*) 135 - 145 mEq/L   Potassium 3.5  3.5 - 5.1 mEq/L   Chloride 99  96 - 112 mEq/L   CO2 23  19 - 32 mEq/L   Glucose, Bld 116 (*) 70 - 99 mg/dL   BUN 12  6 - 23 mg/dL   Creatinine, Ser 7.82  0.50 - 1.10 mg/dL   Calcium 9.3  8.4 - 95.6 mg/dL   Total Protein 6.7  6.0 - 8.3 g/dL   Albumin 3.5  3.5 - 5.2 g/dL   AST 18  0 - 37 U/L   ALT 19  0 - 35 U/L   Alkaline Phosphatase 53  39 - 117 U/L   Total Bilirubin 0.6  0.3 - 1.2 mg/dL   GFR calc non Af Amer 67 (*) >90 mL/min   GFR calc Af Amer 78 (*) >90 mL/min  URINALYSIS, ROUTINE W REFLEX MICROSCOPIC      Component Value Range   Color, Urine AMBER (*) YELLOW   APPearance CLOUDY (*) CLEAR   Specific Gravity, Urine 1.037 (*) 1.005 - 1.030   pH 5.5  5.0 - 8.0   Glucose, UA NEGATIVE  NEGATIVE mg/dL   Hgb urine dipstick NEGATIVE  NEGATIVE   Bilirubin Urine SMALL (*) NEGATIVE   Ketones, ur NEGATIVE  NEGATIVE mg/dL   Protein, ur NEGATIVE  NEGATIVE mg/dL   Urobilinogen, UA 0.2  0.0 - 1.0 mg/dL   Nitrite  NEGATIVE  NEGATIVE   Leukocytes, UA NEGATIVE  NEGATIVE   Vancomycin ordered.          Hanley Seamen, MD 03/13/12 [phone]

## 2012-03-13 NOTE — Progress Notes (Signed)
Patient c/o chest pain radiating to the right.  Points upper epigastric states worse than medium pain.  States pain is aching pain.  EKG NSR, no T wave elevation or depression.  Notified MD, orders received.  Vitals signs stable.  No shortness of breath per patient.  Mild nausea.  Had Zofran at 0600.  GI cocktail ordered.  Will continue to monitor.

## 2012-03-14 NOTE — Progress Notes (Signed)
PROGRESS NOTE  Kathy Cooper UJW:[id number] DOB: 04-24-1941 DOA: 03/13/2012 PCP: Michiel Sites, MD  Brief narrative: 71 yr old CF c h/omastectomy in 1996 by Dr. Luretha Murphy and her oncologist was Dr. Arline Asp up until 2008, presented 03/14/12 with Peau d'orange and RUE cellulitis   Past medical history-As per Problem list Chart reviewed as below- Admission 12/29/11 for failed outpatient cellulitis Rx  Admission 03/14/11 for RUE cellulitis  Seen by Dr. Drue Flirt GI for Barretts esophagus-last office visit 11/26/10-colonoscopy showed 2 tubular adenoma polyps/EGD=barrett's (egd in 2005), Colonoscopy 2003 by Dr. Thana Farr polyps  Admission 05/31/2008 for RUE cellulitis  Consultants:  None currently  Procedures:  none  Antibiotics:  Vancomycin 03/12/2012 >>   Subjective  Feeling well, arm feels much better, has chronic lower extremity pains No nausea no vomiting tolerating breakfast was able to ambulate in room [employer] placed a sling which she has used and was able to elevate her right upper extremity   Objective    Interim History: No issues  Telemetry: None telemetry  Objective: Filed Vitals:   03/13/12 1340 03/13/12 1756 03/13/12 2200 03/14/12 0600  BP: 124/53  125/74 113/70  Pulse: 81  84 75  Temp: 98.1 F (36.7 C) 99.1 F (37.3 C) 98.7 F (37.1 C) 98.8 F (37.1 C)  TempSrc: Oral Oral Oral Oral  Resp: 20  16 16   Height:      Weight:      SpO2: 99%  93% 93%    Intake/Output Summary (Last 24 hours) at 03/14/12 1017 Last data filed at 03/14/12 0600  Gross per 24 hour  Intake    520 ml  Output      0 ml  Net    520 ml    Exam:  General: Alert pleasant oriented obese Caucasian female in no apparent distress Cardiovascular: S1-S2 no murmur rub or gallop Respiratory: Clear Skin decreased redness to the right upper extremity from shoulder to forearm, however there does appear to be induration in the flexor aspect of the right forearm  Data  Reviewed: Basic Metabolic Panel:  Lab 03/13/12 5621 03/13/12 0350  NA -- 133*  K -- 3.5  CL -- 99  CO2 -- 23  GLUCOSE -- 116*  BUN -- 12  CREATININE 0.90 0.86  CALCIUM -- 9.3  MG -- --  PHOS -- --   Liver Function Tests:  Lab 03/13/12 0350  AST 18  ALT 19  ALKPHOS 53  BILITOT 0.6  PROT 6.7  ALBUMIN 3.5   No results found for this basename: LIPASE:5,AMYLASE:5 in the last 168 hours No results found for this basename: AMMONIA:5 in the last 168 hours CBC:  Lab 03/13/12 0808 03/13/12 0350  WBC 11.7* 12.0*  NEUTROABS -- 10.5*  HGB 12.4 13.4  HCT 36.1 39.2  MCV 87.4 87.1  PLT 182 185   Cardiac Enzymes:  Lab 03/13/12 0808  CKTOTAL --  CKMB --  CKMBINDEX --  TROPONINI <0.30   BNP: No components found with this basename: POCBNP:5 CBG: No results found for this basename: GLUCAP:5 in the last 168 hours  No results found for this or any previous visit (from the past 240 hour(s)).   Studies:              All Imaging reviewed and is as per above notation   Scheduled Meds:   . atorvastatin  20 mg Oral Daily  . citalopram  20 mg Oral Daily  . docusate sodium  100 mg Oral BID  .  enoxaparin (LOVENOX) injection  40 mg Subcutaneous Q24H  . influenza  inactive virus vaccine  0.5 mL Intramuscular Tomorrow-1000  . levothyroxine  100 mcg Oral Daily  . pantoprazole  40 mg Oral Q1200  . sodium chloride  3 mL Intravenous Q12H  . vancomycin  1,000 mg Intravenous Q12H   Continuous Infusions:    Assessment/Plan: 1. Right upper extremity chronic lymphedema complicated by cellulitis-continue vancomycin, elevated red extremity, area marked out for comparison tomorrow, CBC remains stable with similar white count however no fevers-we'll repeat tomorrow-potentially transition to by mouth antibiotics depending on appearance 03/15/12, continue when necessary Percocet 2. Hypothyroidism-continue Synthroid 100 mcg daily 3. History of Barrett's esophagus-continue pantoprazole 40 mg  daily-outpatient followup 4. Depression-continue Celexa 20 mg daily, use Ambien sparingly 5. Hyperlipidemia continue Lipitor 20 mg daily  Code Status: Full Family Communication: Patient fully capable of updating herself Disposition Plan: Inpatient   Pleas Koch, MD  [facility] Pager [phone] 03/14/2012, 10:17 AM    LOS: 1 day

## 2012-03-15 LAB — VANCOMYCIN, TROUGH: Vancomycin Tr: 15.7 ug/mL (ref 10.0–20.0)

## 2012-03-15 LAB — BASIC METABOLIC PANEL
CO2: 29 mEq/L (ref 19–32)
Chloride: 101 mEq/L (ref 96–112)
Creatinine, Ser: 0.98 mg/dL (ref 0.50–1.10)
Potassium: 3.5 mEq/L (ref 3.5–5.1)

## 2012-03-15 LAB — CBC
HCT: 36.8 % (ref 36.0–46.0)
Hemoglobin: 12.2 g/dL (ref 12.0–15.0)
MCV: 89.3 fL (ref 78.0–100.0)
RDW: 13.6 % (ref 11.5–15.5)
WBC: 5.2 10*3/uL (ref 4.0–10.5)

## 2012-03-15 MED ORDER — CEPHALEXIN 500 MG PO CAPS
500.0000 mg | ORAL_CAPSULE | Freq: Three times a day (TID) | ORAL | Status: DC
  Administered 2012-03-15 – 2012-03-16 (×3): 500 mg via ORAL
  Filled 2012-03-15 (×6): qty 1

## 2012-03-15 NOTE — Progress Notes (Signed)
PROGRESS NOTE  Kathy Cooper ZOX:[id number] DOB: 23-Aug-1940 DOA: 03/13/2012 PCP: Michiel Sites, MD  Brief narrative: 71 yr old CF c h/omastectomy in 1996 by Dr. Luretha Murphy and her oncologist was Dr. Arline Asp up until 2008, presented 03/14/12 with Peau d'orange and RUE cellulitis-she was kept on vancomycin IV and the swelling subsided along with elevation of the right upper extremity.  I did consult occupational therapy for lymphedema management and she'll be seen by them 03/16/12 a.m. and she was narrowed to Keflex 500 mg 3 times a day 03/15/2029   Past medical history-As per Problem list Chart reviewed as below- Admission 12/29/11 for failed outpatient cellulitis Rx  Admission 03/14/11 for RUE cellulitis  Seen by Dr. Drue Flirt GI for Barretts esophagus-last office visit 11/26/10-colonoscopy showed 2 tubular adenoma polyps/EGD=barrett's (egd in 2005), Colonoscopy 2003 by Dr. Thana Farr polyps  Admission 05/31/2008 for RUE cellulitis  Consultants:  None currently  Procedures:  none  Antibiotics:  Vancomycin 03/12/2012 >> 03/15/2012  Keflex 500 mg 3 times a day 03/15/2012 stop date 03/26/2012   Subjective  Feeling well, arm feels much better, has chronic lower extremity pains Eating well drinking well no other issues [employer] placed a sling which she has used and was able to elevate her right upper extremity   Objective    Interim History: No issues  Telemetry: None telemetry  Objective: Filed Vitals:   03/14/12 0600 03/14/12 1414 03/14/12 2200 03/15/12 0600  BP: 113/70 130/78 121/72 120/74  Pulse: 75 81 70 65  Temp: 98.8 F (37.1 C) 98.1 F (36.7 C) 98.3 F (36.8 C) 97.6 F (36.4 C)  TempSrc: Oral Oral Oral Oral  Resp: 16 18 20 18   Height:      Weight:      SpO2: 93% 97% 96% 94%   No intake or output data in the 24 hours ending 03/15/12 1419  Exam:  General: Alert pleasant oriented obese Caucasian female in no apparent distress Cardiovascular:  S1-S2 no murmur rub or gallop Respiratory: Clear Skin decreased redness to the right upper extremity from shoulder to forearm, induration seems stable to slightly decreased and there is no redness outside of the markings on the right upper extremity  Data Reviewed: Basic Metabolic Panel:  Lab 03/15/12 8119 03/13/12 0808 03/13/12 0350  NA 138 -- 133*  K 3.5 -- 3.5  CL 101 -- 99  CO2 29 -- 23  GLUCOSE 117* -- 116*  BUN 11 -- 12  CREATININE 0.98 0.90 0.86  CALCIUM 9.6 -- 9.3  MG -- -- --  PHOS -- -- --   Liver Function Tests:  Lab 03/13/12 0350  AST 18  ALT 19  ALKPHOS 53  BILITOT 0.6  PROT 6.7  ALBUMIN 3.5   No results found for this basename: LIPASE:5,AMYLASE:5 in the last 168 hours No results found for this basename: AMMONIA:5 in the last 168 hours CBC:  Lab 03/15/12 0433 03/13/12 0808 03/13/12 0350  WBC 5.2 11.7* 12.0*  NEUTROABS -- -- 10.5*  HGB 12.2 12.4 13.4  HCT 36.8 36.1 39.2  MCV 89.3 87.4 87.1  PLT 176 182 185   Cardiac Enzymes:  Lab 03/13/12 0808  CKTOTAL --  CKMB --  CKMBINDEX --  TROPONINI <0.30   BNP: No components found with this basename: POCBNP:5 CBG: No results found for this basename: GLUCAP:5 in the last 168 hours  No results found for this or any previous visit (from the past 240 hour(s)).   Studies:  All Imaging reviewed and is as per above notation   Scheduled Meds:    . atorvastatin  20 mg Oral Daily  . cephALEXin  500 mg Oral Q8H  . citalopram  20 mg Oral Daily  . docusate sodium  100 mg Oral BID  . enoxaparin (LOVENOX) injection  40 mg Subcutaneous Q24H  . levothyroxine  100 mcg Oral Daily  . pantoprazole  40 mg Oral Q1200  . sodium chloride  3 mL Intravenous Q12H  . DISCONTD: vancomycin  1,000 mg Intravenous Q12H   Continuous Infusions:    Assessment/Plan: 1. Right upper extremity chronic lymphedema complicated by cellulitis-continue vancomycin, elevated red extremity, area marked out for comparison  tomorrow, CBC remains stable with similar white count however no fevers-we'll repeat tomorrow-transitioned to by mouth Keflex 03/15/2012. 2. Hypothyroidism-continue Synthroid 100 mcg daily 3. History of Barrett's esophagus-continue pantoprazole 40 mg daily-outpatient followup 4. Depression-continue Celexa 20 mg daily, use Ambien sparingly 5. Hyperlipidemia continue Lipitor 20 mg daily  Code Status: Full Family Communication: Patient fully capable of updating herself Disposition Plan: Inpatient   Pleas Koch, MD  [facility] Pager [phone] 03/15/2012, 2:19 PM    LOS: 2 days

## 2012-03-15 NOTE — Progress Notes (Signed)
ANTIBIOTIC CONSULT NOTE - FOLLOW UP  Pharmacy Consult for vancomycin Indication: cellulitis  No Known Allergies  Patient Measurements: Height: 5\' 1"  (154.9 cm) Weight: 194 lb 8 oz (88.225 kg) IBW/kg (Calculated) : 47.8  Adjusted Body Weight:   Vital Signs: Temp: 98.3 F (36.8 C) (10/06 2200) Temp src: Oral (10/06 2200) BP: 121/72 mmHg (10/06 2200) Pulse Rate: 70  (10/06 2200) Intake/Output from previous day:   Intake/Output from this shift:    Labs:  Basename 03/15/12 0433 03/13/12 0808 03/13/12 0350  WBC 5.2 11.7* 12.0*  HGB 12.2 12.4 13.4  PLT 176 182 185  LABCREA -- -- --  CREATININE 0.98 0.90 0.86   Estimated Creatinine Clearance: 54.7 ml/min (by C-G formula based on Cr of 0.98).  Basename 03/15/12 0433  VANCOTROUGH 15.7  VANCOPEAK --  VANCORANDOM --  GENTTROUGH --  GENTPEAK --  GENTRANDOM --  TOBRATROUGH --  TOBRAPEAK --  TOBRARND --  AMIKACINPEAK --  AMIKACINTROU --  AMIKACIN --     Microbiology: No results found for this or any previous visit (from the past 720 hour(s)).  Anti-infectives     Start     Dose/Rate Route Frequency Ordered Stop   03/13/12 1800   vancomycin (VANCOCIN) IVPB 1000 mg/200 mL premix        1,000 mg 200 mL/hr over 60 Minutes Intravenous Every 12 hours 03/13/12 0638     03/13/12 0445   vancomycin (VANCOCIN) IVPB 1000 mg/200 mL premix        1,000 mg 200 mL/hr over 60 Minutes Intravenous  Once 03/13/12 0437 03/13/12 0646          Assessment: Patient with level above goal of 10-15.  Level was drawn prior to dose on q12 dosing.  Level is also below upper limit for other vancomycin indications.  Would rather patient have slightly higher vs possible below goal.  Goal of Therapy:  Vancomycin trough level 10-15 mcg/ml  Plan:  Measure antibiotic drug levels at steady state Follow up culture results Continue with current dose. Would consider dose reduction if level increases more above now with next  level.  Darlina Guys, Jacquenette Shone Crowford 03/15/2012,6:25 AM

## 2012-03-16 MED ORDER — CEPHALEXIN 500 MG PO CAPS: 500.0000 mg | ORAL_CAPSULE | Freq: Four times a day (QID) | ORAL | Status: AC

## 2012-03-16 NOTE — Discharge Summary (Signed)
Physician Discharge Summary  Kathy Cooper ION:[id number] DOB: 1940-06-29 DOA: 03/13/2012  PCP: Michiel Sites, MD  Admit date: 03/13/2012 Discharge date: 03/16/2012  Recommendations for Outpatient Follow-up:  1. Needs review of right arm in about a week by primary care physician 2. Consider HbA1c as an outpatient and lipid panel 3. Recommend outpatient lymphedema management with occupational therapy one to 2 weeks after she finishes  antibiotic  Discharge Diagnoses:  Active Problems:  RUE Cellulitis  Chronic acquired lymphedema  Hyperlipidemia  Hypothyroidism  Anxiety and depression   Discharge Condition: Good  Diet recommendation: Heart healthy  Filed Weights   03/13/12 0634  Weight: 88.225 kg (194 lb 8 oz)    History of present illness:  Kathy Cooper is an 71 y.o. 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 She had it started at the forearm and within one day, she had it spread to the entire arm. She has low grade subjective fever but no chills. No other symptomology reported. This is the fourth time she had this problem. She had used different stocking and wrap in the past, but she said it was too painful because she has fibromyalgia as well. Evaluation in the ER included a leukocytosis with WBC of 12K., normal renal fx, and BS of 116. Mastectomy 1996 by Dr. Luretha Murphy and  her oncologist was Dr. Arline Asp up until 2008  Hospital Course:  1. Right upper extremity chronic lymphedema complicated by cellulitis-continue vancomycin, elevated red extremity, area marked out for comparison tomorrow, CBC remains stable with similar white count however no fevers-she was transitioned on 03/15/2012 2 by mouth Keflex 500 3 times a day and she states that she has a  supply of this medication actually at home and was taking 500 4 times a day for half a day before she came to the emergency room on admission. I will hence not prescribe any medication-transitioned to by mouth Keflex 03/15/2012 to complete treatment 10/18 2013 2. Hypothyroidism-continue Synthroid 100 mcg daily 3. History of Barrett's esophagus-continue pantoprazole 40 mg daily-outpatient followup 4. Depression-continue Celexa 20 mg daily, use Ambien sparingly 5. Hyperlipidemia continue Lipitor 20 mg daily   Chart reviewed as below-  Admission 12/29/11 for failed outpatient cellulitis Rx  Admission 03/14/11 for RUE cellulitis  Seen by Dr. Drue Flirt GI for Barretts esophagus-last office visit 11/26/10-colonoscopy showed 2 tubular adenoma polyps/EGD=barrett's (egd in 2005), Colonoscopy 2003 by Dr. Thana Farr polyps  Admission 05/31/2008 for RUE cellulitis  Consultations:  Occupational therapy-they recommended outpatient lymphedema wrapping which can be prescribed by primary care physician 2 weeks status post treatment  Discharge Exam: Filed Vitals:   03/15/12 0600 03/15/12 1426 03/15/12 2200 03/16/12 0600  BP: 120/74 117/43 148/79 112/74  Pulse: 65 69 71 64  Temp: 97.6 F (36.4 C) 98.1 F (36.7 C) 98.3 F (36.8 C) 98.2 F (36.8 C)  TempSrc: Oral Oral Oral Oral  Resp: 18 18 18 18   Height:      Weight:      SpO2: 94% 96% 97% 96%     Discharge Instructions  Discharge Orders    Future Orders Please Complete By Expires   Diet - low sodium heart healthy      Increase activity slowly      Call MD for:  persistant nausea and vomiting  Call MD for:  temperature >100.4      Call MD for:  redness, tenderness, or signs of infection (pain, swelling, redness, odor or green/yellow discharge around incision site)      Call MD for:  persistant dizziness or light-headedness      Call MD for:  hives          Medication List     As of 03/16/2012 12:42 PM    TAKE these medications          albuterol 108 (90 BASE) MCG/ACT inhaler   Commonly known as: PROVENTIL HFA;VENTOLIN HFA   Inhale 2 puffs into the lungs every 6 (six) hours as needed for wheezing or shortness of breath. For shortness of breath      atorvastatin 20 MG tablet   Commonly known as: LIPITOR   Take 20 mg by mouth daily.      cephALEXin 500 MG capsule   Commonly known as: KEFLEX   Take 1 capsule (500 mg total) by mouth 4 (four) times daily.      citalopram 20 MG tablet   Commonly known as: CELEXA   Take 20 mg by mouth daily.      levothyroxine 100 MCG tablet   Commonly known as: SYNTHROID, LEVOTHROID   Take 100 mcg by mouth daily.      omeprazole 20 MG capsule   Commonly known as: PRILOSEC   Take 20 mg by mouth daily.      phentermine 37.5 MG capsule   Take 37.5 mg by mouth every morning.      zolpidem 10 MG tablet   Commonly known as: AMBIEN   Take 5 mg by mouth at bedtime as needed.           Follow-up Information    Follow up with Michiel Sites, MD. On 03/23/2012.   Contact information:   [address] [place] [address] [phone]           The results of significant diagnostics from this hospitalization (including imaging, microbiology, ancillary and laboratory) are listed below for reference.    Significant Diagnostic Studies: No results found.  Microbiology: No results found for this or any previous visit (from the past 240 hour(s)).   Labs: Basic Metabolic Panel:  Lab 03/15/12 6578 03/13/12 0808 03/13/12 0350  NA 138 -- 133*  K 3.5 -- 3.5  CL 101 -- 99  CO2 29 -- 23  GLUCOSE 117* -- 116*  BUN 11 -- 12  CREATININE 0.98 0.90 0.86  CALCIUM 9.6 -- 9.3  MG -- -- --  PHOS -- -- --   Liver Function Tests:  Lab 03/13/12 0350  AST 18  ALT 19  ALKPHOS 53  BILITOT 0.6  PROT 6.7  ALBUMIN 3.5   No results found for this basename: LIPASE:5,AMYLASE:5 in the last 168 hours No results found for this basename: AMMONIA:5 in the last 168  hours CBC:  Lab 03/15/12 0433 03/13/12 0808 03/13/12 0350  WBC 5.2 11.7* 12.0*  NEUTROABS -- -- 10.5*  HGB 12.2 12.4 13.4  HCT 36.8 36.1 39.2  MCV 89.3 87.4 87.1  PLT 176 182 185   Cardiac Enzymes:  Lab 03/13/12 0808  CKTOTAL --  CKMB --  CKMBINDEX --  TROPONINI <0.30   BNP: BNP (last 3 results) No results found for this basename: PROBNP:3 in the last 8760 hours CBG: No results found for this basename: GLUCAP:5 in the last 168 hours  Time coordinating discharge: 20 minutes  SignedRhetta Mura  [facility] 03/16/2012, 12:42 PM

## 2012-03-16 NOTE — Progress Notes (Signed)
OT Note:  Pt screened for OT/lymphedema management.  Pt currently with cellulitis which is improving.  Recommend she follow up with OP PT at [facility] for lymphedema management after antibiotics.  Number provided to pt, and she will ask primary MD for a referral/prescription.  [place], OTR/L [phone] 03/16/2012

## 2012-03-16 NOTE — Progress Notes (Signed)
Patient discharged to home.  Reviewed discharge instructions with patient and her husband.  No further questions at this time.  IV removed from left posterior forearm.  Patient assisted to lobby via wheelchair.  Patient discharged.

## 2012-04-07 ENCOUNTER — Ambulatory Visit: Payer: Medicare Other | Admitting: Physical Therapy

## 2012-04-08 ENCOUNTER — Ambulatory Visit: Payer: Medicare Other | Attending: Endocrinology | Admitting: Physical Therapy

## 2012-04-08 DIAGNOSIS — I890 Lymphedema, not elsewhere classified: Secondary | ICD-10-CM | POA: Insufficient documentation

## 2012-04-08 DIAGNOSIS — IMO0001 Reserved for inherently not codable concepts without codable children: Secondary | ICD-10-CM | POA: Insufficient documentation

## 2012-04-14 ENCOUNTER — Ambulatory Visit: Payer: Medicare Other | Attending: Endocrinology

## 2012-04-14 DIAGNOSIS — IMO0001 Reserved for inherently not codable concepts without codable children: Secondary | ICD-10-CM | POA: Insufficient documentation

## 2012-04-14 DIAGNOSIS — I890 Lymphedema, not elsewhere classified: Secondary | ICD-10-CM | POA: Insufficient documentation

## 2012-04-19 ENCOUNTER — Ambulatory Visit: Payer: Medicare Other | Admitting: Physical Therapy

## 2012-04-21 ENCOUNTER — Ambulatory Visit: Payer: Medicare Other

## 2012-04-22 ENCOUNTER — Ambulatory Visit: Payer: Medicare Other | Admitting: Physical Therapy

## 2012-04-26 ENCOUNTER — Ambulatory Visit: Payer: Medicare Other | Admitting: Physical Therapy

## 2012-04-28 ENCOUNTER — Ambulatory Visit: Payer: Medicare Other

## 2012-04-30 ENCOUNTER — Ambulatory Visit: Payer: Medicare Other | Admitting: Physical Therapy

## 2012-05-03 ENCOUNTER — Ambulatory Visit: Payer: Medicare Other | Admitting: Physical Therapy

## 2012-05-05 ENCOUNTER — Ambulatory Visit: Payer: Medicare Other

## 2012-05-10 ENCOUNTER — Ambulatory Visit: Payer: Medicare Other | Attending: Endocrinology

## 2012-05-10 DIAGNOSIS — I890 Lymphedema, not elsewhere classified: Secondary | ICD-10-CM | POA: Insufficient documentation

## 2012-05-10 DIAGNOSIS — IMO0001 Reserved for inherently not codable concepts without codable children: Secondary | ICD-10-CM | POA: Insufficient documentation

## 2012-05-13 ENCOUNTER — Ambulatory Visit: Payer: Medicare Other

## 2012-05-17 ENCOUNTER — Ambulatory Visit: Payer: Medicare Other

## 2012-07-23 ENCOUNTER — Encounter (HOSPITAL_BASED_OUTPATIENT_CLINIC_OR_DEPARTMENT_OTHER): Payer: Self-pay

## 2012-07-23 ENCOUNTER — Emergency Department (HOSPITAL_BASED_OUTPATIENT_CLINIC_OR_DEPARTMENT_OTHER)
Admission: EM | Admit: 2012-07-23 | Discharge: 2012-07-23 | Disposition: A | Discharge status: Home / Self Care | Payer: Medicare Other | Attending: Emergency Medicine | Admitting: Emergency Medicine

## 2012-07-23 DIAGNOSIS — IMO0002 Reserved for concepts with insufficient information to code with codable children: Secondary | ICD-10-CM | POA: Insufficient documentation

## 2012-07-23 DIAGNOSIS — E669 Obesity, unspecified: Secondary | ICD-10-CM | POA: Insufficient documentation

## 2012-07-23 DIAGNOSIS — K2270 Barrett's esophagus without dysplasia: Secondary | ICD-10-CM | POA: Insufficient documentation

## 2012-07-23 DIAGNOSIS — Z872 Personal history of diseases of the skin and subcutaneous tissue: Secondary | ICD-10-CM | POA: Insufficient documentation

## 2012-07-23 DIAGNOSIS — E0500 Thyrotoxicosis with diffuse goiter without thyrotoxic crisis or storm: Secondary | ICD-10-CM | POA: Insufficient documentation

## 2012-07-23 DIAGNOSIS — Z860100 Personal history of colon polyps, unspecified: Secondary | ICD-10-CM | POA: Insufficient documentation

## 2012-07-23 DIAGNOSIS — J45909 Unspecified asthma, uncomplicated: Secondary | ICD-10-CM | POA: Insufficient documentation

## 2012-07-23 DIAGNOSIS — L03113 Cellulitis of right upper limb: Secondary | ICD-10-CM

## 2012-07-23 DIAGNOSIS — Z8719 Personal history of other diseases of the digestive system: Secondary | ICD-10-CM | POA: Insufficient documentation

## 2012-07-23 DIAGNOSIS — Z79899 Other long term (current) drug therapy: Secondary | ICD-10-CM | POA: Insufficient documentation

## 2012-07-23 DIAGNOSIS — R21 Rash and other nonspecific skin eruption: Secondary | ICD-10-CM | POA: Insufficient documentation

## 2012-07-23 DIAGNOSIS — Z853 Personal history of malignant neoplasm of breast: Secondary | ICD-10-CM | POA: Insufficient documentation

## 2012-07-23 DIAGNOSIS — Z87891 Personal history of nicotine dependence: Secondary | ICD-10-CM | POA: Insufficient documentation

## 2012-07-23 DIAGNOSIS — Z8542 Personal history of malignant neoplasm of other parts of uterus: Secondary | ICD-10-CM | POA: Insufficient documentation

## 2012-07-23 DIAGNOSIS — F411 Generalized anxiety disorder: Secondary | ICD-10-CM | POA: Insufficient documentation

## 2012-07-23 DIAGNOSIS — IMO0001 Reserved for inherently not codable concepts without codable children: Secondary | ICD-10-CM | POA: Insufficient documentation

## 2012-07-23 DIAGNOSIS — E785 Hyperlipidemia, unspecified: Secondary | ICD-10-CM | POA: Insufficient documentation

## 2012-07-23 DIAGNOSIS — Z8701 Personal history of pneumonia (recurrent): Secondary | ICD-10-CM | POA: Insufficient documentation

## 2012-07-23 MED ORDER — CEFTRIAXONE SODIUM 1 G IJ SOLR
1.0000 g | Freq: Once | INTRAMUSCULAR | Status: AC
  Administered 2012-07-23: 1 g via INTRAMUSCULAR
  Filled 2012-07-23: qty 10

## 2012-07-23 NOTE — ED Notes (Signed)
MD at bedside. 

## 2012-07-23 NOTE — ED Provider Notes (Signed)
History     CSN: [id number]  Arrival date & time 07/23/12  1224   First MD Initiated Contact with Patient 07/23/12 1239      Chief Complaint  Patient presents with  . Cellulitis    (Consider location/radiation/quality/duration/timing/severity/associated sxs/prior treatment) HPI Comments: Patient was seen by pcp two days ago for rue cellulitis.  She was given im ceftriaxone by her pcp and was to follow up today, however the office was closed due to weather.  She is feeling better and is not having any fever.  History of recurrent cellulitis.  Patient is a 72 y.o. female presenting with rash. The history is provided by the patient.  Rash Location: right arm. Quality: burning   Severity:  Moderate Onset quality:  Gradual Timing:  Constant Progression:  Improving Chronicity:  Recurrent Relieved by:  Nothing Worsened by:  Nothing tried Associated symptoms: no fever     Past Medical History  Diagnosis Date  . Anxiety   . Breast cancer   . Psoriasis   . Fibroids   . Asthma   . Barrett's esophagus   . Colon polyps   . Diverticulosis   . Fibromyalgia   . Hyperlipemia   . Grave's disease   . Obesity   . Pneumonia   . Cellulitis   . Cataracts, bilateral     Past Surgical History  Procedure Laterality Date  . Thyroidectomy    . Mastectomy    . Appendectomy    . Rotator cuff repair  2009    right arm   . Breast surgery      R mastecomty, L reduction    Family History  Problem Relation Age of Onset  . Diabetes Brother   . Heart disease Mother   . Kidney disease Brother   . Asthma Mother   . Stroke Father   . Heart failure Sister   .       History  Substance Use Topics  . Smoking status: Former Smoker -- 25 years    Quit date: 12/29/1994  . Smokeless tobacco: Former Neurosurgeon    Quit date: 12/29/1995  . Alcohol Use: No    OB History   Grav Para Term Preterm Abortions TAB SAB Ect Mult Living                  Review of Systems  Constitutional:  Negative for fever.  Skin: Positive for rash.  All other systems reviewed and are negative.    Allergies  Review of patient's allergies indicates no known allergies.  Home Medications   Current Outpatient Rx  Name  Route  Sig  Dispense  Refill  . cephALEXin (KEFLEX) 500 MG capsule   Oral   Take 500 mg by mouth 4 (four) times daily.         . furosemide (LASIX) 40 MG tablet   Oral   Take 40 mg by mouth daily.         Marland Kitchen albuterol (PROVENTIL HFA;VENTOLIN HFA) 108 (90 BASE) MCG/ACT inhaler   Inhalation   Inhale 2 puffs into the lungs every 6 (six) hours as needed for wheezing or shortness of breath. For shortness of breath   1 Inhaler   3   . atorvastatin (LIPITOR) 20 MG tablet   Oral   Take 20 mg by mouth daily.         . citalopram (CELEXA) 20 MG tablet   Oral   Take 20 mg by mouth daily.           Marland Kitchen  levothyroxine (SYNTHROID, LEVOTHROID) 100 MCG tablet   Oral   Take 100 mcg by mouth daily.           Marland Kitchen omeprazole (PRILOSEC) 20 MG capsule   Oral   Take 20 mg by mouth daily.           Marland Kitchen zolpidem (AMBIEN) 10 MG tablet   Oral   Take 5 mg by mouth at bedtime as needed.           BP 148/52  Pulse 75  Temp(Src) 97.8 F (36.6 C) (Oral)  Resp 18  Ht 5' 0.75" (1.543 m)  Wt 195 lb (88.451 kg)  BMI 37.15 kg/m2  SpO2 97%  Physical Exam  Nursing note and vitals reviewed. Constitutional: She is oriented to person, place, and time. She appears well-developed and well-nourished. No distress.  HENT:  Head: Normocephalic and atraumatic.  Neck: Normal range of motion. Neck supple.  Neurological: She is alert and oriented to person, place, and time.  Skin: Skin is warm and dry. She is not diaphoretic.  There is warmth and erythema to the right forearm.  The distal pulses are intact.      ED Course  Procedures (including critical care time)  Labs Reviewed - No data to display No results found.   No diagnosis found.    MDM  Will give an additional  dose of ceftriaxone, continue keflex.  Recheck on Monday with pcp.        Geoffery Lyons, MD 07/23/12 1253

## 2012-07-23 NOTE — ED Notes (Signed)
Pt is being treated for cellulitis in right arm, seen by PMD Tuesday and started on Keflex.  She had a f/u appt today but P[facility]D office is closed.

## 2012-08-14 ENCOUNTER — Emergency Department (HOSPITAL_BASED_OUTPATIENT_CLINIC_OR_DEPARTMENT_OTHER)
Admission: EM | Admit: 2012-08-14 | Discharge: 2012-08-14 | Disposition: A | Discharge status: Home / Self Care | Payer: Medicare Other | Attending: Emergency Medicine | Admitting: Emergency Medicine

## 2012-08-14 ENCOUNTER — Encounter (HOSPITAL_BASED_OUTPATIENT_CLINIC_OR_DEPARTMENT_OTHER): Payer: Self-pay | Admitting: *Deleted

## 2012-08-14 DIAGNOSIS — E669 Obesity, unspecified: Secondary | ICD-10-CM | POA: Insufficient documentation

## 2012-08-14 DIAGNOSIS — F411 Generalized anxiety disorder: Secondary | ICD-10-CM | POA: Insufficient documentation

## 2012-08-14 DIAGNOSIS — IMO0002 Reserved for concepts with insufficient information to code with codable children: Secondary | ICD-10-CM | POA: Insufficient documentation

## 2012-08-14 DIAGNOSIS — Z860100 Personal history of colon polyps, unspecified: Secondary | ICD-10-CM | POA: Insufficient documentation

## 2012-08-14 DIAGNOSIS — K2270 Barrett's esophagus without dysplasia: Secondary | ICD-10-CM | POA: Insufficient documentation

## 2012-08-14 DIAGNOSIS — Z862 Personal history of diseases of the blood and blood-forming organs and certain disorders involving the immune mechanism: Secondary | ICD-10-CM | POA: Insufficient documentation

## 2012-08-14 DIAGNOSIS — Z8669 Personal history of other diseases of the nervous system and sense organs: Secondary | ICD-10-CM | POA: Insufficient documentation

## 2012-08-14 DIAGNOSIS — Z87891 Personal history of nicotine dependence: Secondary | ICD-10-CM | POA: Insufficient documentation

## 2012-08-14 DIAGNOSIS — Z79899 Other long term (current) drug therapy: Secondary | ICD-10-CM | POA: Insufficient documentation

## 2012-08-14 DIAGNOSIS — Z872 Personal history of diseases of the skin and subcutaneous tissue: Secondary | ICD-10-CM | POA: Insufficient documentation

## 2012-08-14 DIAGNOSIS — Z853 Personal history of malignant neoplasm of breast: Secondary | ICD-10-CM | POA: Insufficient documentation

## 2012-08-14 DIAGNOSIS — J45909 Unspecified asthma, uncomplicated: Secondary | ICD-10-CM | POA: Insufficient documentation

## 2012-08-14 DIAGNOSIS — E785 Hyperlipidemia, unspecified: Secondary | ICD-10-CM | POA: Insufficient documentation

## 2012-08-14 DIAGNOSIS — Z8719 Personal history of other diseases of the digestive system: Secondary | ICD-10-CM | POA: Insufficient documentation

## 2012-08-14 DIAGNOSIS — Z8701 Personal history of pneumonia (recurrent): Secondary | ICD-10-CM | POA: Insufficient documentation

## 2012-08-14 DIAGNOSIS — Z8739 Personal history of other diseases of the musculoskeletal system and connective tissue: Secondary | ICD-10-CM | POA: Insufficient documentation

## 2012-08-14 DIAGNOSIS — L0390 Cellulitis, unspecified: Secondary | ICD-10-CM

## 2012-08-14 DIAGNOSIS — Z8742 Personal history of other diseases of the female genital tract: Secondary | ICD-10-CM | POA: Insufficient documentation

## 2012-08-14 DIAGNOSIS — Z8639 Personal history of other endocrine, nutritional and metabolic disease: Secondary | ICD-10-CM | POA: Insufficient documentation

## 2012-08-14 MED ORDER — CEFTRIAXONE SODIUM 1 G IJ SOLR
1.0000 g | Freq: Once | INTRAMUSCULAR | Status: AC
  Administered 2012-08-14: 1 g via INTRAMUSCULAR
  Filled 2012-08-14: qty 10

## 2012-08-14 MED ORDER — LIDOCAINE HCL (PF) 1 % IJ SOLN
INTRAMUSCULAR | Status: AC
  Administered 2012-08-14: 2.1 mL
  Filled 2012-08-14: qty 5

## 2012-08-14 NOTE — ED Notes (Signed)
Requesting antibiotics for cellulitus

## 2012-08-14 NOTE — ED Provider Notes (Signed)
History     CSN: [id number]  Arrival date & time 08/14/12  1224   First MD Initiated Contact with Patient 08/14/12 1316      Chief Complaint  Patient presents with  . Cellulitis    (Consider location/radiation/quality/duration/timing/severity/associated sxs/prior treatment) The history is provided by the patient.   Patient requesting IM injection of Rocephin for treatment of her right arm cellulitis. Was seen at her Dr. last week and given IM injection for same. Denies any fever or chills. Denies any progression of her cellulitis. History of multiple episodes of recurrence without etiology. Symptoms have been persistent and nothing makes it worse Past Medical History  Diagnosis Date  . Anxiety   . Breast cancer   . Psoriasis   . Fibroids   . Asthma   . Barrett's esophagus   . Colon polyps   . Diverticulosis   . Fibromyalgia   . Hyperlipemia   . Grave's disease   . Obesity   . Pneumonia   . Cellulitis   . Cataracts, bilateral     Past Surgical History  Procedure Laterality Date  . Thyroidectomy    . Mastectomy    . Appendectomy    . Rotator cuff repair  2009    right arm   . Breast surgery      R mastecomty, L reduction    Family History  Problem Relation Age of Onset  . Diabetes Brother   . Heart disease Mother   . Kidney disease Brother   . Asthma Mother   . Stroke Father   . Heart failure Sister   .       History  Substance Use Topics  . Smoking status: Former Smoker -- 25 years    Quit date: 12/29/1994  . Smokeless tobacco: Former Neurosurgeon    Quit date: 12/29/1995  . Alcohol Use: No    OB History   Grav Para Term Preterm Abortions TAB SAB Ect Mult Living                  Review of Systems  All other systems reviewed and are negative.    Allergies  Review of patient's allergies indicates no known allergies.  Home Medications   Current Outpatient Rx  Name  Route  Sig  Dispense  Refill  . albuterol (PROVENTIL HFA;VENTOLIN HFA) 108 (90  BASE) MCG/ACT inhaler   Inhalation   Inhale 2 puffs into the lungs every 6 (six) hours as needed for wheezing or shortness of breath. For shortness of breath   1 Inhaler   3   . atorvastatin (LIPITOR) 20 MG tablet   Oral   Take 20 mg by mouth daily.         . cephALEXin (KEFLEX) 500 MG capsule   Oral   Take 500 mg by mouth 4 (four) times daily.         . citalopram (CELEXA) 20 MG tablet   Oral   Take 20 mg by mouth daily.           . furosemide (LASIX) 40 MG tablet   Oral   Take 40 mg by mouth daily.         Marland Kitchen levothyroxine (SYNTHROID, LEVOTHROID) 100 MCG tablet   Oral   Take 100 mcg by mouth daily.           Marland Kitchen omeprazole (PRILOSEC) 20 MG capsule   Oral   Take 20 mg by mouth daily.           Marland Kitchen  zolpidem (AMBIEN) 10 MG tablet   Oral   Take 5 mg by mouth at bedtime as needed.           BP 160/64  Pulse 74  Temp(Src) 98.1 F (36.7 C) (Oral)  Resp 18  Ht 5' 1.5" (1.562 m)  Wt 200 lb (90.719 kg)  BMI 37.18 kg/m2  SpO2 97%  Physical Exam  Nursing note and vitals reviewed. Constitutional: She is oriented to person, place, and time. She appears well-developed and well-nourished.  Non-toxic appearance. No distress.  HENT:  Head: Normocephalic and atraumatic.  Eyes: Conjunctivae, EOM and lids are normal. Pupils are equal, round, and reactive to light.  Neck: Normal range of motion. Neck supple. No tracheal deviation present. No mass present.  Cardiovascular: Normal rate, regular rhythm and normal heart sounds.  Exam reveals no gallop.   No murmur heard. Pulmonary/Chest: Effort normal and breath sounds normal. No stridor. No respiratory distress. She has no decreased breath sounds. She has no wheezes. She has no rhonchi. She has no rales.  Abdominal: Soft. Normal appearance and bowel sounds are normal. She exhibits no distension. There is no tenderness. There is no rebound and no CVA tenderness.  Musculoskeletal: Normal range of motion. She exhibits no edema  and no tenderness.  Erythema noted on patient's forearm extending to her upper arm without circumferential involvement. No crepitus noted. Radial pulses 2+.  Neurological: She is alert and oriented to person, place, and time. She has normal strength. No cranial nerve deficit or sensory deficit. GCS eye subscore is 4. GCS verbal subscore is 5. GCS motor subscore is 6.  Skin: Skin is warm and dry. No abrasion and no rash noted.  Psychiatric: She has a normal mood and affect. Her speech is normal and behavior is normal.    ED Course  Procedures (including critical care time)  Labs Reviewed - No data to display No results found.   No diagnosis found.    MDM  Patient given IM dose of Rocephin and will return tomorrow for a second dose and followup next week with her Dr.        Toy Baker, MD 08/14/12 1326

## 2012-08-15 ENCOUNTER — Encounter (HOSPITAL_BASED_OUTPATIENT_CLINIC_OR_DEPARTMENT_OTHER): Payer: Self-pay

## 2012-08-15 ENCOUNTER — Emergency Department (HOSPITAL_BASED_OUTPATIENT_CLINIC_OR_DEPARTMENT_OTHER)
Admission: EM | Admit: 2012-08-15 | Discharge: 2012-08-15 | Disposition: A | Discharge status: Home / Self Care | Payer: Medicare Other | Attending: Emergency Medicine | Admitting: Emergency Medicine

## 2012-08-15 DIAGNOSIS — Z8701 Personal history of pneumonia (recurrent): Secondary | ICD-10-CM | POA: Insufficient documentation

## 2012-08-15 DIAGNOSIS — Z862 Personal history of diseases of the blood and blood-forming organs and certain disorders involving the immune mechanism: Secondary | ICD-10-CM | POA: Insufficient documentation

## 2012-08-15 DIAGNOSIS — Z8639 Personal history of other endocrine, nutritional and metabolic disease: Secondary | ICD-10-CM | POA: Insufficient documentation

## 2012-08-15 DIAGNOSIS — IMO0002 Reserved for concepts with insufficient information to code with codable children: Secondary | ICD-10-CM | POA: Insufficient documentation

## 2012-08-15 DIAGNOSIS — Z87891 Personal history of nicotine dependence: Secondary | ICD-10-CM | POA: Insufficient documentation

## 2012-08-15 DIAGNOSIS — L03113 Cellulitis of right upper limb: Secondary | ICD-10-CM

## 2012-08-15 DIAGNOSIS — J45909 Unspecified asthma, uncomplicated: Secondary | ICD-10-CM | POA: Insufficient documentation

## 2012-08-15 DIAGNOSIS — E669 Obesity, unspecified: Secondary | ICD-10-CM | POA: Insufficient documentation

## 2012-08-15 DIAGNOSIS — Z853 Personal history of malignant neoplasm of breast: Secondary | ICD-10-CM | POA: Insufficient documentation

## 2012-08-15 DIAGNOSIS — Z872 Personal history of diseases of the skin and subcutaneous tissue: Secondary | ICD-10-CM | POA: Insufficient documentation

## 2012-08-15 DIAGNOSIS — F411 Generalized anxiety disorder: Secondary | ICD-10-CM | POA: Insufficient documentation

## 2012-08-15 DIAGNOSIS — Z860100 Personal history of colon polyps, unspecified: Secondary | ICD-10-CM | POA: Insufficient documentation

## 2012-08-15 DIAGNOSIS — Z8719 Personal history of other diseases of the digestive system: Secondary | ICD-10-CM | POA: Insufficient documentation

## 2012-08-15 DIAGNOSIS — IMO0001 Reserved for inherently not codable concepts without codable children: Secondary | ICD-10-CM | POA: Insufficient documentation

## 2012-08-15 DIAGNOSIS — E785 Hyperlipidemia, unspecified: Secondary | ICD-10-CM | POA: Insufficient documentation

## 2012-08-15 DIAGNOSIS — Z8742 Personal history of other diseases of the female genital tract: Secondary | ICD-10-CM | POA: Insufficient documentation

## 2012-08-15 DIAGNOSIS — Z8669 Personal history of other diseases of the nervous system and sense organs: Secondary | ICD-10-CM | POA: Insufficient documentation

## 2012-08-15 DIAGNOSIS — Z79899 Other long term (current) drug therapy: Secondary | ICD-10-CM | POA: Insufficient documentation

## 2012-08-15 MED ORDER — CEFTRIAXONE SODIUM 1 G IJ SOLR
1.0000 g | Freq: Once | INTRAMUSCULAR | Status: AC
  Administered 2012-08-15: 1 g via INTRAMUSCULAR
  Filled 2012-08-15: qty 10

## 2012-08-15 MED ORDER — LIDOCAINE HCL (PF) 1 % IJ SOLN
INTRAMUSCULAR | Status: AC
  Administered 2012-08-15: 2.1 mL
  Filled 2012-08-15: qty 5

## 2012-08-15 NOTE — ED Notes (Signed)
Pt states that she was seen yesterday and told to come back for another shot of rocephin.  Symptoms are improving per pt.

## 2012-08-15 NOTE — ED Provider Notes (Signed)
History     CSN: [id number]  Arrival date & time 08/15/12  1203   First MD Initiated Contact with Patient 08/15/12 1408      Chief Complaint  Patient presents with  . Follow-up    (Consider location/radiation/quality/duration/timing/severity/associated sxs/prior treatment) HPI Comments: Patient here yesterday for im ceftriaxone.  She has a history of cellulitis to the right forearm and has been getting these injections.  Was here yesterday for same and was to come back.  Feeling better.  No fevers or chills.  The history is provided by the patient.    Past Medical History  Diagnosis Date  . Anxiety   . Breast cancer   . Psoriasis   . Fibroids   . Asthma   . Barrett's esophagus   . Colon polyps   . Diverticulosis   . Fibromyalgia   . Hyperlipemia   . Grave's disease   . Obesity   . Pneumonia   . Cellulitis   . Cataracts, bilateral     Past Surgical History  Procedure Laterality Date  . Thyroidectomy    . Mastectomy    . Appendectomy    . Rotator cuff repair  2009    right arm   . Breast surgery      R mastecomty, L reduction    Family History  Problem Relation Age of Onset  . Diabetes Brother   . Heart disease Mother   . Kidney disease Brother   . Asthma Mother   . Stroke Father   . Heart failure Sister   .       History  Substance Use Topics  . Smoking status: Former Smoker -- 25 years    Quit date: 12/29/1994  . Smokeless tobacco: Former Neurosurgeon    Quit date: 12/29/1995  . Alcohol Use: No    OB History   Grav Para Term Preterm Abortions TAB SAB Ect Mult Living                  Review of Systems  All other systems reviewed and are negative.    Allergies  Review of patient's allergies indicates no known allergies.  Home Medications   Current Outpatient Rx  Name  Route  Sig  Dispense  Refill  . albuterol (PROVENTIL HFA;VENTOLIN HFA) 108 (90 BASE) MCG/ACT inhaler   Inhalation   Inhale 2 puffs into the lungs every 6 (six) hours as  needed for wheezing or shortness of breath. For shortness of breath   1 Inhaler   3   . atorvastatin (LIPITOR) 20 MG tablet   Oral   Take 20 mg by mouth daily.         . cephALEXin (KEFLEX) 500 MG capsule   Oral   Take 500 mg by mouth 4 (four) times daily.         . citalopram (CELEXA) 20 MG tablet   Oral   Take 20 mg by mouth daily.           Marland Kitchen levothyroxine (SYNTHROID, LEVOTHROID) 100 MCG tablet   Oral   Take 100 mcg by mouth daily.           Marland Kitchen omeprazole (PRILOSEC) 20 MG capsule   Oral   Take 20 mg by mouth daily.           Marland Kitchen sulfamethoxazole-trimethoprim (BACTRIM DS) 800-160 MG per tablet   Oral   Take 1 tablet by mouth 2 (two) times daily.         Marland Kitchen  zolpidem (AMBIEN) 10 MG tablet   Oral   Take 5 mg by mouth at bedtime as needed.         . furosemide (LASIX) 40 MG tablet   Oral   Take 40 mg by mouth daily.           BP 113/61  Pulse 77  Temp(Src) 98.9 F (37.2 C) (Oral)  Resp 20  Ht 5' 0.5" (1.537 m)  Wt 200 lb (90.719 kg)  BMI 38.4 kg/m2  SpO2 100%  Physical Exam  Nursing note and vitals reviewed. Constitutional: She is oriented to person, place, and time. She appears well-developed and well-nourished. No distress.  HENT:  Head: Normocephalic and atraumatic.  Mouth/Throat: Oropharynx is clear and moist.  Neck: Normal range of motion. Neck supple.  Musculoskeletal: Normal range of motion.  Neurological: She is alert and oriented to person, place, and time.  Skin: Skin is warm and dry. She is not diaphoretic.  There is a mild erythema and warmth to the right forearm.  The distal pulses are intact.      ED Course  Procedures (including critical care time)  Labs Reviewed - No data to display No results found.   No diagnosis found.    MDM  Improving cellulitis.  Will give additional ceftriaxone, discharge to home.        Geoffery Lyons, MD 08/15/12 1415

## 2012-08-22 ENCOUNTER — Encounter (HOSPITAL_BASED_OUTPATIENT_CLINIC_OR_DEPARTMENT_OTHER): Payer: Self-pay

## 2012-08-22 ENCOUNTER — Emergency Department (HOSPITAL_BASED_OUTPATIENT_CLINIC_OR_DEPARTMENT_OTHER)
Admission: EM | Admit: 2012-08-22 | Discharge: 2012-08-22 | Disposition: A | Discharge status: Home / Self Care | Payer: Medicare Other | Attending: Emergency Medicine | Admitting: Emergency Medicine

## 2012-08-22 DIAGNOSIS — IMO0002 Reserved for concepts with insufficient information to code with codable children: Secondary | ICD-10-CM | POA: Insufficient documentation

## 2012-08-22 DIAGNOSIS — E669 Obesity, unspecified: Secondary | ICD-10-CM | POA: Insufficient documentation

## 2012-08-22 DIAGNOSIS — Z860100 Personal history of colon polyps, unspecified: Secondary | ICD-10-CM | POA: Insufficient documentation

## 2012-08-22 DIAGNOSIS — F411 Generalized anxiety disorder: Secondary | ICD-10-CM | POA: Insufficient documentation

## 2012-08-22 DIAGNOSIS — Z87891 Personal history of nicotine dependence: Secondary | ICD-10-CM | POA: Insufficient documentation

## 2012-08-22 DIAGNOSIS — Z8719 Personal history of other diseases of the digestive system: Secondary | ICD-10-CM | POA: Insufficient documentation

## 2012-08-22 DIAGNOSIS — Z8739 Personal history of other diseases of the musculoskeletal system and connective tissue: Secondary | ICD-10-CM | POA: Insufficient documentation

## 2012-08-22 DIAGNOSIS — E785 Hyperlipidemia, unspecified: Secondary | ICD-10-CM | POA: Insufficient documentation

## 2012-08-22 DIAGNOSIS — Z853 Personal history of malignant neoplasm of breast: Secondary | ICD-10-CM | POA: Insufficient documentation

## 2012-08-22 DIAGNOSIS — Z79899 Other long term (current) drug therapy: Secondary | ICD-10-CM | POA: Insufficient documentation

## 2012-08-22 DIAGNOSIS — Z872 Personal history of diseases of the skin and subcutaneous tissue: Secondary | ICD-10-CM | POA: Insufficient documentation

## 2012-08-22 DIAGNOSIS — J45909 Unspecified asthma, uncomplicated: Secondary | ICD-10-CM | POA: Insufficient documentation

## 2012-08-22 DIAGNOSIS — Z8742 Personal history of other diseases of the female genital tract: Secondary | ICD-10-CM | POA: Insufficient documentation

## 2012-08-22 DIAGNOSIS — Z8701 Personal history of pneumonia (recurrent): Secondary | ICD-10-CM | POA: Insufficient documentation

## 2012-08-22 DIAGNOSIS — E0500 Thyrotoxicosis with diffuse goiter without thyrotoxic crisis or storm: Secondary | ICD-10-CM | POA: Insufficient documentation

## 2012-08-22 DIAGNOSIS — K2270 Barrett's esophagus without dysplasia: Secondary | ICD-10-CM | POA: Insufficient documentation

## 2012-08-22 DIAGNOSIS — Z8669 Personal history of other diseases of the nervous system and sense organs: Secondary | ICD-10-CM | POA: Insufficient documentation

## 2012-08-22 MED ORDER — CEFTRIAXONE SODIUM 1 G IJ SOLR
1.0000 g | Freq: Once | INTRAMUSCULAR | Status: AC
  Administered 2012-08-22: 1 g via INTRAMUSCULAR
  Filled 2012-08-22: qty 10

## 2012-08-22 MED ORDER — LIDOCAINE HCL (PF) 1 % IJ SOLN
INTRAMUSCULAR | Status: AC
  Administered 2012-08-22: 2.1 mL
  Filled 2012-08-22: qty 5

## 2012-08-22 NOTE — ED Provider Notes (Signed)
History     CSN: [id number]  Arrival date & time 08/22/12  1227   First MD Initiated Contact with Patient 08/22/12 1240      Chief Complaint  Patient presents with  . Cellulitis    (Consider location/radiation/quality/duration/timing/severity/associated sxs/prior treatment) Patient is a 72 y.o. female presenting with arm injury. The history is provided by the patient. No language interpreter was used.  Arm Injury Location:  Arm Arm location:  R arm Pt reports she is getting shots of rocephin daily. Pt has an rx from Dr. Juleen China  For rocephin.   Pt reports she is getting better  Past Medical History  Diagnosis Date  . Anxiety   . Breast cancer   . Psoriasis   . Fibroids   . Asthma   . Barrett's esophagus   . Colon polyps   . Diverticulosis   . Fibromyalgia   . Hyperlipemia   . Grave's disease   . Obesity   . Pneumonia   . Cellulitis   . Cataracts, bilateral     Past Surgical History  Procedure Laterality Date  . Thyroidectomy    . Mastectomy    . Appendectomy    . Rotator cuff repair  2009    right arm   . Breast surgery      R mastecomty, L reduction    Family History  Problem Relation Age of Onset  . Diabetes Brother   . Heart disease Mother   . Kidney disease Brother   . Asthma Mother   . Stroke Father   . Heart failure Sister   .       History  Substance Use Topics  . Smoking status: Former Smoker -- 25 years    Quit date: 12/29/1994  . Smokeless tobacco: Former Neurosurgeon    Quit date: 12/29/1995  . Alcohol Use: No    OB History   Grav Para Term Preterm Abortions TAB SAB Ect Mult Living                  Review of Systems  Skin: Positive for color change.  All other systems reviewed and are negative.    Allergies  Review of patient's allergies indicates no known allergies.  Home Medications   Current Outpatient Rx  Name  Route  Sig  Dispense  Refill  . albuterol (PROVENTIL HFA;VENTOLIN HFA) 108 (90 BASE) MCG/ACT inhaler  Inhalation   Inhale 2 puffs into the lungs every 6 (six) hours as needed for wheezing or shortness of breath. For shortness of breath   1 Inhaler   3   . atorvastatin (LIPITOR) 20 MG tablet   Oral   Take 20 mg by mouth daily.         . cephALEXin (KEFLEX) 500 MG capsule   Oral   Take 500 mg by mouth 4 (four) times daily.         . citalopram (CELEXA) 20 MG tablet   Oral   Take 20 mg by mouth daily.           . furosemide (LASIX) 40 MG tablet   Oral   Take 40 mg by mouth daily.         Marland Kitchen levothyroxine (SYNTHROID, LEVOTHROID) 100 MCG tablet   Oral   Take 100 mcg by mouth daily.           Marland Kitchen omeprazole (PRILOSEC) 20 MG capsule   Oral   Take 20 mg by mouth daily.           Marland Kitchen  sulfamethoxazole-trimethoprim (BACTRIM DS) 800-160 MG per tablet   Oral   Take 1 tablet by mouth 2 (two) times daily.         Marland Kitchen zolpidem (AMBIEN) 10 MG tablet   Oral   Take 5 mg by mouth at bedtime as needed.           BP 163/61  Pulse 82  Temp(Src) 98.1 F (36.7 C) (Oral)  Resp 20  SpO2 93%  Physical Exam  Nursing note and vitals reviewed. Constitutional: She appears well-developed and well-nourished.  HENT:  Head: Normocephalic and atraumatic.  Musculoskeletal:  Erythema right arm   Neurological: She is alert.  Skin: Skin is warm.  Psychiatric: She has a normal mood and affect.    ED Course  Procedures (including critical care time)  Labs Reviewed - No data to display No results found.   No diagnosis found.    MDM  Rocephin IM        Elson Areas, PA-C 08/22/12 1301

## 2012-08-22 NOTE — ED Provider Notes (Signed)
Medical screening examination/treatment/procedure(s) were performed by non-physician practitioner and as supervising physician I was immediately available for consultation/collaboration.   Charles B. Bernette Mayers, MD 08/22/12 [phone]

## 2012-08-22 NOTE — ED Notes (Signed)
Pt states that she is here for another IM injection of Rocephin for cellulitis to the R arm.

## 2012-08-25 ENCOUNTER — Ambulatory Visit: Payer: Medicare Other | Admitting: Infectious Disease

## 2012-08-26 ENCOUNTER — Ambulatory Visit (INDEPENDENT_AMBULATORY_CARE_PROVIDER_SITE_OTHER): Payer: Medicare Other | Admitting: Infectious Disease

## 2012-08-26 ENCOUNTER — Encounter: Payer: Self-pay | Admitting: Infectious Disease

## 2012-08-26 VITALS — BP 146/76 | HR 72 | Temp 98.0°F | Wt 203.0 lb

## 2012-08-26 DIAGNOSIS — IMO0002 Reserved for concepts with insufficient information to code with codable children: Secondary | ICD-10-CM

## 2012-08-26 MED ORDER — AMOXICILLIN-POT CLAVULANATE 875-125 MG PO TABS: 1.0000 | ORAL_TABLET | Freq: Two times a day (BID) | ORAL | Status: DC

## 2012-08-26 NOTE — Progress Notes (Signed)
Subjective:    Patient ID: Kathy Cooper, female    DOB: Aug 25, 1940, 72 y.o.   MRN: [id number]  HPI  72 year old Caucasian female with a history of breast cancer status post radical right-sided mastectomy with axillary lymph node dissection and chronic right-sided lymphedema. Over the past 2-3 years she has suffered from recurrent episodes of cellulitis having sever than 3 episodes at least in the last year alone. He was hospitalized in in June and treated with broad-spectrum antibiotics in the form of vancomycin and Zosyn while it was a long hospital and then transitioned to oral antibiotics. She was also hospitalized recently in October to the hospitalist service and treated with vancomycin and had improvement before being transitioned more back to oral Keflex. He talked to the patient appears that many of these episodes of cellulitis do seem to respond to therapy with Keflex. Her most recent flare began in February and which time she was started on oral Keflex at 500 mg 4 times daily. Your edema began to improve but did not resolve and her primary care doctor began giving her intramuscular doses of ceftriaxone daily basis for nearly 2 weeks. He also added Bactrim double strength one tablet twice daily. This past Sunday the patient stopped taking her oral Keflex and had had her last dose of intermuscular ceftriaxone while continuing on on the Bactrim twice daily. Since then she has had worsening erythema in her arm.  Have athlete no culture data from any abscess on the patient's arm and no blood culture data to guide my therapy. My suspicion is that this is likely driven by the streptococcal species. She does have a small dog at home which at times will leave up in her arms and scratch her right arm where she has lymphedema raising the possibility of introduction of Pasteurella multocida.   I spent greater than 60 minutes with the patient including greater than 50% of time in face to face counsel of the  patient and in coordination of their care.      Review of Systems  Constitutional: Negative for fever, chills, diaphoresis, activity change, appetite change, fatigue and unexpected weight change.  HENT: Negative for congestion, sore throat, rhinorrhea, sneezing, trouble swallowing and sinus pressure.   Eyes: Negative for photophobia and visual disturbance.  Respiratory: Negative for cough, chest tightness, shortness of breath, wheezing and stridor.   Cardiovascular: Negative for chest pain, palpitations and leg swelling.  Gastrointestinal: Negative for nausea, vomiting, abdominal pain, diarrhea, constipation, blood in stool, abdominal distention and anal bleeding.  Genitourinary: Negative for dysuria, hematuria, flank pain and difficulty urinating.  Musculoskeletal: Negative for myalgias, back pain, joint swelling, arthralgias and gait problem.  Skin: Positive for color change. Negative for pallor, rash and wound.  Neurological: Negative for dizziness, tremors, weakness and light-headedness.  Hematological: Negative for adenopathy. Does not bruise/bleed easily.  Psychiatric/Behavioral: Negative for behavioral problems, confusion, sleep disturbance, dysphoric mood, decreased concentration and agitation.       Objective:   Physical Exam  Constitutional: She is oriented to person, place, and time. She appears well-developed and well-nourished. No distress.  HENT:  Head: Normocephalic and atraumatic.  Mouth/Throat: Oropharynx is clear and moist.  Eyes: Conjunctivae and EOM are normal.  Neck: Normal range of motion. Neck supple. No JVD present.  Cardiovascular: Normal rate, regular rhythm and normal heart sounds.   Pulmonary/Chest: Effort normal and breath sounds normal. No respiratory distress. She has no wheezes.  Abdominal: She exhibits no distension.  Musculoskeletal: She exhibits edema.  She exhibits no tenderness.       Arms: Neurological: She is alert and oriented to person,  place, and time. She exhibits normal muscle tone. Coordination normal.  Skin: Skin is warm and dry. She is not diaphoretic. There is erythema. No pallor.  Psychiatric: She has a normal mood and affect. Her behavior is normal. Judgment and thought content normal.          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 Her colonizing the skin. Certainly streptococci are notorious for being pathogens in this type of recurrent infection. Her spouse to cephalexin would suggest that she indeed may have  recurrent infection with streptococcal species. Certainly methicillin sensitive staph aureus could also be implicated. I more skeptical that methicillin resistant staph aureus a plain role again because of her response to cephalexin.  --For now I will place her on Augmentin 875/125 twice daily for the next 3 weeks. This would cover streptococcal species methicillin sensitive staphylococcal species as well as Pasteurella although I think the latter is unlikely pathogen in her right now. I do not think methicillin-resistant staph aureus is a player here given the worsening of her symptoms in spite of her being on Bactrim twice daily.  I will bring her back next week for followup to make sure that she is progressing well.  He'll likely then try to place her on a chronic suppressive dose of effective therapy such as possibly chronic amoxicillin.  Screening: check her for HIV.

## 2012-08-27 ENCOUNTER — Other Ambulatory Visit (INDEPENDENT_AMBULATORY_CARE_PROVIDER_SITE_OTHER): Payer: Medicare Other

## 2012-08-27 DIAGNOSIS — L0291 Cutaneous abscess, unspecified: Secondary | ICD-10-CM

## 2012-08-27 DIAGNOSIS — L0390 Cellulitis, unspecified: Secondary | ICD-10-CM

## 2012-08-27 LAB — CBC WITH DIFFERENTIAL/PLATELET
Basophils Absolute: 0 10*3/uL (ref 0.0–0.1)
Basophils Relative: 0 % (ref 0–1)
Eosinophils Absolute: 0.3 10*3/uL (ref 0.0–0.7)
MCH: 30.2 pg (ref 26.0–34.0)
MCHC: 34.3 g/dL (ref 30.0–36.0)
Neutrophils Relative %: 63 % (ref 43–77)
Platelets: 221 10*3/uL (ref 150–400)

## 2012-08-27 LAB — COMPLETE METABOLIC PANEL WITH GFR
ALT: 18 U/L (ref 0–35)
Alkaline Phosphatase: 51 U/L (ref 39–117)
GFR, Est Non African American: 68 mL/min
Glucose, Bld: 128 mg/dL — ABNORMAL HIGH (ref 70–99)
Sodium: 138 mEq/L (ref 135–145)
Total Bilirubin: 0.5 mg/dL (ref 0.3–1.2)
Total Protein: 6.5 g/dL (ref 6.0–8.3)

## 2012-09-01 ENCOUNTER — Ambulatory Visit (INDEPENDENT_AMBULATORY_CARE_PROVIDER_SITE_OTHER): Payer: Medicare Other | Admitting: Infectious Disease

## 2012-09-01 ENCOUNTER — Encounter: Payer: Self-pay | Admitting: Infectious Disease

## 2012-09-01 VITALS — BP 153/71 | HR 75 | Temp 98.1°F | Wt 205.0 lb

## 2012-09-01 DIAGNOSIS — I890 Lymphedema, not elsewhere classified: Secondary | ICD-10-CM

## 2012-09-01 DIAGNOSIS — L03113 Cellulitis of right upper limb: Secondary | ICD-10-CM

## 2012-09-01 DIAGNOSIS — IMO0002 Reserved for concepts with insufficient information to code with codable children: Secondary | ICD-10-CM

## 2012-09-01 NOTE — Progress Notes (Signed)
Subjective:    Patient ID: Kathy Cooper, female    DOB: 1940/11/20, 72 y.o.   MRN: [id number]  HPI  72 year old Caucasian female with a history of breast cancer status post radical right-sided mastectomy with axillary lymph node dissection and chronic right-sided lymphedema. Over the past 2-3 years she has suffered from recurrent episodes of cellulitis having sever than 3 episodes at least in the last year alone. He was hospitalized in in June and treated with broad-spectrum antibiotics in the form of vancomycin and Zosyn while it was a long hospital and then transitioned to oral antibiotics. She was also hospitalized recently in October to the hospitalist service and treated with vancomycin and had improvement before being transitioned more back to oral Keflex. He talked to the patient appears that many of these episodes of cellulitis do seem to respond to therapy with Keflex. Her most recent flare began in February 2013 and which time she was started on oral Keflex at 500 mg 4 times daily. Your edema began to improve but did not resolve and her primary care doctor began giving her intramuscular doses of ceftriaxone daily basis for nearly 2 weeks. He also added Bactrim double strength one tablet twice daily. This past Sunday the patient stopped taking her oral Keflex and had had her last dose of intermuscular ceftriaxone while continuing on on the Bactrim twice daily. Since then she has had worsening erythema in her arm.  no culture data from any abscess on the patient's arm and no blood culture data to guide my therapy.   My suspicion has been  that this is likely driven by the streptococcal species. She does have a small dog at home which at times will leave up in her arms and scratch her right arm where she has lymphedema raising the possibility of introduction of Pasteurella multocida. We ended up treating her with Augmentin I saw her last week and on this therapy her cellulitis has stabilized. It  certainly has not yet completely resolved although I am rather underwhelmed by her exam findings at present. She lacks fevers or malaise or other systemic symptoms at present. She is suffering from some loose stool related to the clavulanic acid.  Review of Systems  Constitutional: Negative for fever, chills, diaphoresis, activity change, appetite change, fatigue and unexpected weight change.  HENT: Negative for congestion, sore throat, rhinorrhea, sneezing, trouble swallowing and sinus pressure.   Eyes: Negative for photophobia and visual disturbance.  Respiratory: Negative for cough, chest tightness, shortness of breath, wheezing and stridor.   Cardiovascular: Negative for chest pain, palpitations and leg swelling.  Gastrointestinal: Negative for nausea, vomiting, abdominal pain, diarrhea, constipation, blood in stool, abdominal distention and anal bleeding.  Genitourinary: Negative for dysuria, hematuria, flank pain and difficulty urinating.  Musculoskeletal: Negative for myalgias, back pain, joint swelling, arthralgias and gait problem.  Skin: Positive for color change. Negative for pallor, rash and wound.  Neurological: Negative for dizziness, tremors, weakness and light-headedness.  Hematological: Negative for adenopathy. Does not bruise/bleed easily.  Psychiatric/Behavioral: Negative for behavioral problems, confusion, sleep disturbance, dysphoric mood, decreased concentration and agitation.       Objective:   Physical Exam  Constitutional: She is oriented to person, place, and time. She appears well-developed and well-nourished. No distress.  HENT:  Head: Normocephalic and atraumatic.  Mouth/Throat: Oropharynx is clear and moist.  Eyes: Conjunctivae and EOM are normal.  Neck: Normal range of motion. Neck supple. No JVD present.  Cardiovascular: Normal rate, regular rhythm and normal  heart sounds.   Pulmonary/Chest: Effort normal and breath sounds normal. No respiratory distress.  She has no wheezes.  Abdominal: She exhibits no distension.  Musculoskeletal: She exhibits edema. She exhibits no tenderness.       Arms: Neurological: She is alert and oriented to person, place, and time. She exhibits normal muscle tone. Coordination normal.  Skin: Skin is warm and dry. She is not diaphoretic. There is erythema. No pallor.  Psychiatric: She has a normal mood and affect. Her behavior is normal. Judgment and thought content normal.          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 Her colonizing the skin. Certainly streptococci are notorious for being pathogens in this type of recurrent infection. Her spouse to cephalexin would suggest that she indeed may have  recurrent infection with streptococcal species. Certainly methicillin sensitive staph aureus could also be implicated. I more skeptical that methicillin resistant staph aureus a plain role again because of her response to cephalexin. I have thefefore put her on  Augmentin 875/125 twice daily for the next 3 weeks.   Today I feel that her appearance is stable and despite her anxiety re persistent erythema I am satisfied with her progress. I will have her followup in 2nd week of April   We will  likely then try to place her on a chronic suppressive dose of effective therapy such as possibly chronic amoxicillin.  Screening: HIV negative

## 2012-09-13 ENCOUNTER — Ambulatory Visit: Payer: Medicare Other | Admitting: Infectious Disease

## 2012-09-14 ENCOUNTER — Encounter: Payer: Self-pay | Admitting: Infectious Diseases

## 2012-09-14 ENCOUNTER — Ambulatory Visit (INDEPENDENT_AMBULATORY_CARE_PROVIDER_SITE_OTHER): Payer: Medicare Other | Admitting: Infectious Diseases

## 2012-09-14 VITALS — BP 152/81 | HR 70 | Temp 98.0°F | Ht 61.5 in | Wt 203.0 lb

## 2012-09-14 DIAGNOSIS — L0390 Cellulitis, unspecified: Secondary | ICD-10-CM

## 2012-09-14 DIAGNOSIS — L0291 Cutaneous abscess, unspecified: Secondary | ICD-10-CM

## 2012-09-14 NOTE — Progress Notes (Signed)
  Subjective:    Patient ID: Kathy Cooper, female    DOB: [birth year], 72 y.o.   MRN: [id number]  HPI 72 yo F  with a history of breast cancer status post radical right-sided mastectomy with axillary lymph node dissection and chronic right-sided lymphedema (1996). Recently (last 4 years), she has suffered from recurrent episodes of cellulitis having 3 episodes at least in the last year alone. She has been treated with vanco/zosyn, injections of ceftriaxone and oral keflex.  Beginning 07-20-12 was seen in PMDs office after she developed increased erythema, pain, and pruritis in her L arm. Was started in ceftriaxone (13 injections).She was last seen in ID on 09-01-12 and given 3 weeks of augmentin.  Swelling not better. Tenderness improved. Kathy Cooper has felt hot.   Review of Systems  Constitutional: Negative for fever and chills.  Gastrointestinal: Positive for diarrhea. Negative for constipation.  Musculoskeletal: Positive for myalgias.       Objective:   Physical Exam  Constitutional: She appears well-developed and well-nourished.  Musculoskeletal:       Arms:         Assessment & Plan:

## 2012-09-14 NOTE — Assessment & Plan Note (Signed)
Currently I suspect that she has more lymphedema than infection. I asked her to wear her compression stocking on her arm for the next week. She will continue her augmentin for the next week and we will re-assess her then. If she fails this, could consider PIC line and IV vanco. Will consider prophylactic PEN at the end of this rx.

## 2012-09-21 ENCOUNTER — Ambulatory Visit (INDEPENDENT_AMBULATORY_CARE_PROVIDER_SITE_OTHER): Payer: Medicare Other | Admitting: Infectious Diseases

## 2012-09-21 ENCOUNTER — Encounter: Payer: Self-pay | Admitting: Infectious Diseases

## 2012-09-21 VITALS — BP 151/70 | HR 72 | Temp 98.1°F | Ht 61.0 in | Wt 203.0 lb

## 2012-09-21 DIAGNOSIS — L0390 Cellulitis, unspecified: Secondary | ICD-10-CM

## 2012-09-21 DIAGNOSIS — I890 Lymphedema, not elsewhere classified: Secondary | ICD-10-CM

## 2012-09-21 DIAGNOSIS — L0291 Cutaneous abscess, unspecified: Secondary | ICD-10-CM

## 2012-09-21 NOTE — Assessment & Plan Note (Signed)
I encouraged her to continue to use the compression stockings on her upper extremity as well as keeping the arm elevated. I also encouraged her to start diuretics under the direction of her PCP. Will defer on further anbx at this point. She has a refill of Augmentin if she needs it, will see her ASAP if she has recurrence.

## 2012-09-21 NOTE — Progress Notes (Signed)
  Subjective:    Patient ID: Kathy Cooper, female    DOB: Nov 18, 1940, 72 y.o.   MRN: [id number]  HPI 72 yo F  with a history of breast cancer status post radical right-sided mastectomy with axillary lymph node dissection and chronic right-sided lymphedema (1996). Recently (last 4 years), she has suffered from recurrent episodes of cellulitis having 3 episodes at least in the last year alone. She has been treated with vanco/zosyn, injections of ceftriaxone and oral keflex.   Beginning 07-20-12 was seen in PMDs office after she developed increased erythema, pain, and pruritis in her L arm. Was started in ceftriaxone (13 injections).She was seen in ID on 09-01-12 and given 3 weeks of augmentin.  She had f/u on 4-8 and was continued on her augmentin and was advised to wear a compression stocking on her LUE.  Today she has continued swelling in her RUE. Also had swelling in her LLE as well. Finished anbx on 4-11. No f/c. Has been wearing her RUE compression sleeve.   Review of Systems     Objective:   Physical Exam  Constitutional: She appears well-developed and well-nourished.  Musculoskeletal:       Arms:      Legs:         Assessment & Plan:

## 2012-09-21 NOTE — Assessment & Plan Note (Signed)
I encouraged her to swear compression stockings, keep her leg elevated, start diuretic under the direction of her PCP.

## 2012-10-05 ENCOUNTER — Encounter: Payer: Self-pay | Admitting: Infectious Diseases

## 2012-10-05 ENCOUNTER — Ambulatory Visit (INDEPENDENT_AMBULATORY_CARE_PROVIDER_SITE_OTHER): Payer: Medicare Other | Admitting: Infectious Diseases

## 2012-10-05 VITALS — BP 123/74 | HR 72 | Temp 97.8°F | Ht 60.75 in | Wt 199.8 lb

## 2012-10-05 DIAGNOSIS — L0390 Cellulitis, unspecified: Secondary | ICD-10-CM

## 2012-10-05 DIAGNOSIS — L0291 Cutaneous abscess, unspecified: Secondary | ICD-10-CM

## 2012-10-05 MED ORDER — AMOXICILLIN-POT CLAVULANATE 875-125 MG PO TABS: 1.0000 | ORAL_TABLET | Freq: Two times a day (BID) | ORAL | Status: DC

## 2012-10-05 NOTE — Progress Notes (Signed)
  Subjective:    Patient ID: Kathy Cooper, female    DOB: 1940/11/11, 72 y.o.   MRN: [id number]  HPI 72 yo F with a history of breast cancer status post radical right-sided mastectomy with axillary lymph node dissection and chronic right-sided lymphedema (1996). Recently (last 4 years), she has suffered from recurrent episodes of cellulitis having 3 episodes at least in the last year alone. She has been treated with vanco/zosyn, injections of ceftriaxone and oral keflex.  Beginning 07-20-12 was seen in PMDs office after she developed increased erythema, pain, and pruritis in her L arm. Was started in ceftriaxone (13 injections).She was seen in ID on 09-01-12 and given 3 weeks of augmentin. She had f/u on 4-8 and was continued on her augmentin and was advised to wear a compression stocking on her LUE.  Was seen 09-21-13 with continued swelling in her RUE. She was advised to wear her compression stocking, keep her arm elevated.   Today states she has not worn her sleeve for the last few days. States "it irritates me to death".  Has been given k-dur for her previous low potassium. Started on diuretic by PCP.  Has lost 6# since March 2014.  Has been seen by derm recently- eczema on her arms and legs, therapy for onychomycosis, psoriasis on her hands (?).    Review of Systems     Objective:   Physical Exam  Constitutional: She appears well-developed and well-nourished.  Skin:             Assessment & Plan:

## 2012-10-05 NOTE — Assessment & Plan Note (Signed)
She is doing well. Her swelling is better, decreased erythema. i will see her back prn. My great appreciation to Derm for evaluating the area on her LLE.

## 2013-01-12 ENCOUNTER — Other Ambulatory Visit: Payer: Self-pay

## 2013-04-14 ENCOUNTER — Other Ambulatory Visit: Payer: Self-pay

## 2013-05-16 ENCOUNTER — Ambulatory Visit (INDEPENDENT_AMBULATORY_CARE_PROVIDER_SITE_OTHER): Payer: Medicare Other | Admitting: Internal Medicine

## 2013-05-16 VITALS — BP 120/80 | HR 66 | Ht 61.0 in | Wt 202.0 lb

## 2013-05-16 DIAGNOSIS — R0602 Shortness of breath: Secondary | ICD-10-CM

## 2013-05-16 DIAGNOSIS — R002 Palpitations: Secondary | ICD-10-CM

## 2013-05-16 MED ORDER — OMEPRAZOLE 20 MG PO CPDR: 20.0000 mg | DELAYED_RELEASE_CAPSULE | Freq: Two times a day (BID) | ORAL | Status: AC

## 2013-05-16 MED ORDER — FLUTICASONE PROPIONATE 50 MCG/ACT NA SUSP: 1.0000 | Freq: Every day | NASAL | Status: DC

## 2013-05-16 MED ORDER — FLUTICASONE PROPIONATE HFA 220 MCG/ACT IN AERO: 1.0000 | INHALATION_SPRAY | Freq: Every day | RESPIRATORY_TRACT | Status: DC

## 2013-05-16 NOTE — Progress Notes (Signed)
Resting spo2 97% on room air pulse 65 bpm Ambulating > 100 ft spo2 95% pulse 86 bpm

## 2013-05-16 NOTE — Progress Notes (Signed)
HPI Patient is a 72 yo who is referred for evaluation of CP I saw her husb  A few months ago  Pain under L breast   More SOB than usual Pain down L arm  Pain at rest mostly  Palptiations  Mostly at night.  Occasionally during day  No dizziness  Some times last a few min   Does have a lot of them  Most every day  No Known Allergies  Current Outpatient Prescriptions  Medication Sig Dispense Refill  . albuterol (PROVENTIL HFA;VENTOLIN HFA) 108 (90 BASE) MCG/ACT inhaler Inhale 2 puffs into the lungs every 6 (six) hours as needed for wheezing or shortness of breath. For shortness of breath  1 Inhaler  3  . atorvastatin (LIPITOR) 20 MG tablet Take 20 mg by mouth daily.      . citalopram (CELEXA) 20 MG tablet Take 20 mg by mouth daily.        . Diclofenac Sodium (PENNSAID) 2 % SOLN Place 1 application onto the skin 2 (two) times daily.      . furosemide (LASIX) 40 MG tablet Take 40 mg by mouth daily.      Marland Kitchen levothyroxine (SYNTHROID, LEVOTHROID) 100 MCG tablet Take 100 mcg by mouth daily.        Marland Kitchen omeprazole (PRILOSEC) 20 MG capsule Take 20 mg by mouth daily.        Marland Kitchen zolpidem (AMBIEN) 10 MG tablet Take 5 mg by mouth at bedtime as needed.       No current facility-administered medications for this visit.    Past Medical History  Diagnosis Date  . Anxiety   . Breast cancer   . Psoriasis   . Fibroids   . Asthma   . Barrett's esophagus   . Colon polyps   . Diverticulosis   . Fibromyalgia   . Hyperlipemia   . Grave's disease   . Obesity   . Pneumonia   . Cellulitis   . Cataracts, bilateral     Past Surgical History  Procedure Laterality Date  . Thyroidectomy    . Mastectomy    . Appendectomy    . Rotator cuff repair  2009    right arm   . Breast surgery      R mastecomty, L reduction    Family History  Problem Relation Age of Onset  . Diabetes Brother   . Heart disease Mother   . Asthma Mother   . Kidney disease Brother   . Cancer Brother     pancrease with  metastasis to liver  . Stroke Father   . Heart failure Sister   .       History   Social History  . Marital Status: Married    Spouse Name: Dorene Sorrow    Number of Children: 4  . Years of Education: 14   Occupational History  . Retired, Audiological scientist    Social History Main Topics  . Smoking status: Former Smoker -- 25 years    Quit date: 12/29/1994  . Smokeless tobacco: Never Used  . Alcohol Use: No  . Drug Use: No  . Sexual Activity: Not on file   Other Topics Concern  . Not on file   Social History Narrative   Divorced and Re-Married.  Lives with husband.  Independent with ADLs and with ambulation.    Review of Systems:  All systems reviewed.  They are negative to the above problem except as previously stated.  Vital Signs: BP 120/80  Pulse 66  Ht 5\' 1"  (1.549 m)  Wt 202 lb (91.627 kg)  BMI 38.19 kg/m2  Physical Exam Patient is a morbidly obese 72 yo in NAD HEENT:  Normocephalic, atraumatic. EOMI, PERRLA.  Neck: JVP is normal.  No bruits.  Lungs: Decreased expiratory flow with wheezes and rhonchi Heart: Regular rate and rhythm. Normal S1, S2. No S3.   No significant murmurs. PMI not displaced.  Abdomen:  Supple, nontender. Normal bowel sounds. No masses. No hepatomegaly.  Extremities:   Good distal pulses throughout. No lower extremity edema.  Musculoskeletal :moving all extremities.  Neuro:   alert and oriented x3.  CN II-XII grossly intact.   EKG  SR 66 bpm    Assessment and Plan:  1.  SOB  pateint with signif wheezing and decreased airflow on exam  This may explain SOB   I would recomm trial of flonase and flovent  F/u in 3 wk  I Increae prilosec to BID  2.  CP  Atypical  May be related to 1  ? GI  I would increase omeprazole  F/U in 3 wks    3.  HTN  BP is good  4.  HL  LDL particle number is increased  Keep on statin  WIll need to address.

## 2013-05-16 NOTE — Patient Instructions (Signed)
Your physician has recommended you make the following change in your medication:  Start Flonase 1 spray each nostril daily Start Flovent 1 puff daily inhaled Increase omeprozole  To 20 mg twice daily   Your physician recommends that you schedule a follow-up appointment in: 3 weeks

## 2013-05-17 ENCOUNTER — Telehealth: Payer: Self-pay

## 2013-05-17 NOTE — Telephone Encounter (Signed)
Patient called stating that she could not get her flovent filled it cost 288 dollars, I called the pharm to see why it was do high. They did not know what was going on with the cost of the med. So i called the patient back to let her know that she needed to call her insurance to find out what was going on with the cost and to call me back.

## 2013-05-18 ENCOUNTER — Telehealth: Payer: Self-pay

## 2013-05-19 NOTE — Telephone Encounter (Signed)
I discussed with the pharm md, there is no generic steroid inhalers. If cont to have problems or if they have asthma they need to follow up with PCP or pulmonary doctor.

## 2013-05-20 NOTE — Telephone Encounter (Signed)
I spoke with pharmacy  No cheaper. Could see if she could get samples from pulmonary clinic for asmanex.

## 2013-05-20 NOTE — Telephone Encounter (Signed)
, °

## 2013-06-13 ENCOUNTER — Ambulatory Visit: Payer: Medicare Other | Admitting: Internal Medicine

## 2013-06-24 ENCOUNTER — Encounter: Payer: Self-pay | Admitting: *Deleted

## 2013-06-24 ENCOUNTER — Ambulatory Visit: Payer: Medicare Other | Admitting: Internal Medicine

## 2013-07-10 ENCOUNTER — Encounter (HOSPITAL_BASED_OUTPATIENT_CLINIC_OR_DEPARTMENT_OTHER): Payer: Self-pay | Admitting: Emergency Medicine

## 2013-07-10 ENCOUNTER — Emergency Department (HOSPITAL_BASED_OUTPATIENT_CLINIC_OR_DEPARTMENT_OTHER)
Admission: EM | Admit: 2013-07-10 | Discharge: 2013-07-10 | Disposition: A | Discharge status: Home / Self Care | Payer: Medicare Other | Attending: Emergency Medicine | Admitting: Emergency Medicine

## 2013-07-10 DIAGNOSIS — Y9389 Activity, other specified: Secondary | ICD-10-CM | POA: Insufficient documentation

## 2013-07-10 DIAGNOSIS — H269 Unspecified cataract: Secondary | ICD-10-CM | POA: Insufficient documentation

## 2013-07-10 DIAGNOSIS — W260XXA Contact with knife, initial encounter: Secondary | ICD-10-CM | POA: Insufficient documentation

## 2013-07-10 DIAGNOSIS — Z860100 Personal history of colon polyps, unspecified: Secondary | ICD-10-CM | POA: Insufficient documentation

## 2013-07-10 DIAGNOSIS — W261XXA Contact with sword or dagger, initial encounter: Secondary | ICD-10-CM

## 2013-07-10 DIAGNOSIS — IMO0002 Reserved for concepts with insufficient information to code with codable children: Secondary | ICD-10-CM | POA: Insufficient documentation

## 2013-07-10 DIAGNOSIS — Z87891 Personal history of nicotine dependence: Secondary | ICD-10-CM | POA: Insufficient documentation

## 2013-07-10 DIAGNOSIS — Z8701 Personal history of pneumonia (recurrent): Secondary | ICD-10-CM | POA: Insufficient documentation

## 2013-07-10 DIAGNOSIS — Z79899 Other long term (current) drug therapy: Secondary | ICD-10-CM | POA: Insufficient documentation

## 2013-07-10 DIAGNOSIS — Z853 Personal history of malignant neoplasm of breast: Secondary | ICD-10-CM | POA: Insufficient documentation

## 2013-07-10 DIAGNOSIS — T148XXA Other injury of unspecified body region, initial encounter: Secondary | ICD-10-CM

## 2013-07-10 DIAGNOSIS — S61409A Unspecified open wound of unspecified hand, initial encounter: Secondary | ICD-10-CM | POA: Insufficient documentation

## 2013-07-10 DIAGNOSIS — K2270 Barrett's esophagus without dysplasia: Secondary | ICD-10-CM | POA: Insufficient documentation

## 2013-07-10 DIAGNOSIS — E785 Hyperlipidemia, unspecified: Secondary | ICD-10-CM | POA: Insufficient documentation

## 2013-07-10 DIAGNOSIS — Z791 Long term (current) use of non-steroidal anti-inflammatories (NSAID): Secondary | ICD-10-CM | POA: Insufficient documentation

## 2013-07-10 DIAGNOSIS — F411 Generalized anxiety disorder: Secondary | ICD-10-CM | POA: Insufficient documentation

## 2013-07-10 DIAGNOSIS — Y929 Unspecified place or not applicable: Secondary | ICD-10-CM | POA: Insufficient documentation

## 2013-07-10 DIAGNOSIS — Z8739 Personal history of other diseases of the musculoskeletal system and connective tissue: Secondary | ICD-10-CM | POA: Insufficient documentation

## 2013-07-10 DIAGNOSIS — Z8742 Personal history of other diseases of the female genital tract: Secondary | ICD-10-CM | POA: Insufficient documentation

## 2013-07-10 DIAGNOSIS — Z23 Encounter for immunization: Secondary | ICD-10-CM | POA: Insufficient documentation

## 2013-07-10 DIAGNOSIS — E669 Obesity, unspecified: Secondary | ICD-10-CM | POA: Insufficient documentation

## 2013-07-10 DIAGNOSIS — E0500 Thyrotoxicosis with diffuse goiter without thyrotoxic crisis or storm: Secondary | ICD-10-CM | POA: Insufficient documentation

## 2013-07-10 DIAGNOSIS — J45909 Unspecified asthma, uncomplicated: Secondary | ICD-10-CM | POA: Insufficient documentation

## 2013-07-10 DIAGNOSIS — S61412A Laceration without foreign body of left hand, initial encounter: Secondary | ICD-10-CM

## 2013-07-10 DIAGNOSIS — Z872 Personal history of diseases of the skin and subcutaneous tissue: Secondary | ICD-10-CM | POA: Insufficient documentation

## 2013-07-10 DIAGNOSIS — S60559A Superficial foreign body of unspecified hand, initial encounter: Secondary | ICD-10-CM | POA: Insufficient documentation

## 2013-07-10 MED ORDER — BACITRACIN 500 UNIT/GM EX OINT
1.0000 "application " | TOPICAL_OINTMENT | Freq: Two times a day (BID) | CUTANEOUS | Status: DC
  Administered 2013-07-10: 1 via TOPICAL
  Filled 2013-07-10: qty 0.9

## 2013-07-10 MED ORDER — TETANUS-DIPHTH-ACELL PERTUSSIS 5-2.5-18.5 LF-MCG/0.5 IM SUSP
0.5000 mL | Freq: Once | INTRAMUSCULAR | Status: AC
  Administered 2013-07-10: 0.5 mL via INTRAMUSCULAR
  Filled 2013-07-10: qty 0.5

## 2013-07-10 MED ORDER — AMOXICILLIN-POT CLAVULANATE 875-125 MG PO TABS
ORAL_TABLET | ORAL | Status: AC
  Administered 2013-07-10: 1
  Filled 2013-07-10: qty 1

## 2013-07-10 MED ORDER — AMOXICILLIN-POT CLAVULANATE 875-125 MG PO TABS: 1.0000 | ORAL_TABLET | Freq: Two times a day (BID) | ORAL | Status: DC

## 2013-07-10 NOTE — ED Provider Notes (Signed)
CSN: [id number]     Arrival date & time 07/10/13  1804 History   First MD Initiated Contact with Patient 07/10/13 1853     Chief Complaint  Patient presents with  . Extremity Laceration   (Consider location/radiation/quality/duration/timing/severity/associated sxs/prior Treatment) Patient is a 73 y.o. female presenting with skin laceration. The history is provided by the patient.  Laceration Location:  Hand Hand laceration location:  L hand Depth:  Through underlying tissue Bleeding: controlled with pressure   Laceration mechanism:  Knife Pain details:    Severity:  Mild   Timing:  Constant   Progression:  Unchanged Foreign body present:  No foreign bodies Relieved by:  Pressure Worsened by:  Nothing tried Tetanus status:  Unknown  Kathy Cooper is a 73 y.o. female who presents to the ED with a puncture laceration to the left hand. She was using a kitchen knife and it slipped and punctured her hand. Bleeding controlled with pressure. She also complains of a wound to the palm of her left hand where she got a splinter in it yesterday and today it is tender and red.  Past Medical History  Diagnosis Date  . Anxiety   . Breast cancer   . Psoriasis   . Fibroids   . Asthma   . Barrett's esophagus   . Colon polyps   . Diverticulosis   . Fibromyalgia   . Hyperlipemia   . Grave's disease   . Obesity   . Pneumonia   . Cellulitis   . Cataracts, bilateral    Past Surgical History  Procedure Laterality Date  . Thyroidectomy    . Mastectomy    . Appendectomy    . Rotator cuff repair  2009    right arm   . Breast surgery      R mastecomty, L reduction   Family History  Problem Relation Age of Onset  . Diabetes Brother   . Heart disease Mother   . Asthma Mother   . Kidney disease Brother   . Pancreatic cancer Brother     metastasis to liver  . Stroke Father   . Heart failure Sister   . Liver cancer Brother   . Heart attack Sister   . Heart attack Mother    History    Substance Use Topics  . Smoking status: Former Smoker -- 25 years    Quit date: 12/29/1994  . Smokeless tobacco: Never Used  . Alcohol Use: No   OB History   Grav Para Term Preterm Abortions TAB SAB Ect Mult Living                 Review of Systems Negative except as stated in HPI  Allergies  Review of patient's allergies indicates no known allergies.  Home Medications   Current Outpatient Rx  Name  Route  Sig  Dispense  Refill  . albuterol (PROVENTIL HFA;VENTOLIN HFA) 108 (90 BASE) MCG/ACT inhaler   Inhalation   Inhale 2 puffs into the lungs every 6 (six) hours as needed for wheezing or shortness of breath. For shortness of breath   1 Inhaler   3   . atorvastatin (LIPITOR) 20 MG tablet   Oral   Take 20 mg by mouth daily.         . citalopram (CELEXA) 20 MG tablet   Oral   Take 20 mg by mouth daily.           . Diclofenac Sodium (PENNSAID) 2 % SOLN  Transdermal   Place 1 application onto the skin 2 (two) times daily.         . fluticasone (FLONASE) 50 MCG/ACT nasal spray   Each Nare   Place 1 spray into both nostrils daily.   16 g   1   . fluticasone (FLOVENT HFA) 220 MCG/ACT inhaler   Inhalation   Inhale 1 puff into the lungs daily.   1 Inhaler   1   . furosemide (LASIX) 40 MG tablet   Oral   Take 40 mg by mouth daily.         Marland Kitchen levothyroxine (SYNTHROID, LEVOTHROID) 100 MCG tablet   Oral   Take 100 mcg by mouth daily.           Marland Kitchen omeprazole (PRILOSEC) 20 MG capsule   Oral   Take 1 capsule (20 mg total) by mouth 2 (two) times daily before a meal.   60 capsule   5   . zolpidem (AMBIEN) 10 MG tablet   Oral   Take 5 mg by mouth at bedtime as needed.          BP 160/78  Pulse 66  Temp(Src) 98.5 F (36.9 C)  Resp 20  SpO2 99% Physical Exam  Nursing note and vitals reviewed. Constitutional: She is oriented to person, place, and time. She appears well-developed and well-nourished. No distress.  HENT:  Head: Normocephalic and  atraumatic.  Eyes: Conjunctivae and EOM are normal.  Neck: Neck supple.  Cardiovascular: Normal rate.   Pulmonary/Chest: Effort normal.  Musculoskeletal: Normal range of motion.       Hands: 1 cm laceration to the webb space of the left thumb and wound to the thenar aspect of the hand s/p splinter removal. There is minimal erythema noted.   Neurological: She is alert and oriented to person, place, and time. No cranial nerve deficit.  Skin: Skin is warm and dry.  Psychiatric: She has a normal mood and affect. Her behavior is normal.    ED Course  Procedures  LACERATION REPAIR Performed by: NEESE,HOPE Authorized by: NEESE,HOPE Consent: Verbal consent obtained. Risks and benefits: risks, benefits and alternatives were discussed Consent given by: patient Patient identity confirmed: provided demographic data Prepped and Draped in normal sterile fashion Wound explored  Laceration Location: left thumb  Laceration Length: 1cm  No Foreign Bodies seen or palpated  Anesthesia: local infiltration  Local anesthetic: lidocaine 2%  without epinephrine  Anesthetic total: 1 ml  Irrigation method: syringe Amount of cleaning: standard  Skin closure: 5-0 prolene  Number of sutures: 2  Technique: interrupted Bacitracin ointment and dressing applied  Patient tolerance: Patient tolerated the procedure well with no immediate complications.  MDM  73 y.o. female with laceration to the webb space of the left hand thumb and wound to the thenar aspect of the left hand s/p splinter removal. Discussed with the patient clinical findings and plan of care. All questioned fully answered. She will follow up with her PCP in 7 days for suture removal or return here for any signs of infection.  She request antibiotics other than Bactrim or Keflex as they tend to upset her stomach.      [place], [place] 07/10/13 2118

## 2013-07-10 NOTE — Discharge Instructions (Signed)
We are treating you with antibiotics for possible infection where the splinter was and to prevent infection of the puncture wound. Follow up with your doctor or return here in 7 days for suture removal. Return sooner for problems or any signs of infection.

## 2013-07-10 NOTE — ED Notes (Signed)
Suture cart is at the bedside set up and ready for the doctor to use. 

## 2013-07-10 NOTE — ED Notes (Signed)
Patient has lac to left hand, cut with kitchen knife.

## 2013-07-13 NOTE — ED Provider Notes (Signed)
Medical screening examination/treatment/procedure(s) were performed by non-physician practitioner and as supervising physician I was immediately available for consultation/collaboration.  EKG Interpretation   None         Tanna Furry, MD 07/13/13 [phone]

## 2013-09-16 ENCOUNTER — Emergency Department (HOSPITAL_BASED_OUTPATIENT_CLINIC_OR_DEPARTMENT_OTHER)
Admission: EM | Admit: 2013-09-16 | Discharge: 2013-09-16 | Disposition: A | Discharge status: Home / Self Care | Payer: Medicare Other | Attending: Emergency Medicine | Admitting: Emergency Medicine

## 2013-09-16 ENCOUNTER — Encounter (HOSPITAL_BASED_OUTPATIENT_CLINIC_OR_DEPARTMENT_OTHER): Payer: Self-pay | Admitting: Emergency Medicine

## 2013-09-16 DIAGNOSIS — Z8742 Personal history of other diseases of the female genital tract: Secondary | ICD-10-CM | POA: Insufficient documentation

## 2013-09-16 DIAGNOSIS — Z79899 Other long term (current) drug therapy: Secondary | ICD-10-CM | POA: Insufficient documentation

## 2013-09-16 DIAGNOSIS — Z860100 Personal history of colon polyps, unspecified: Secondary | ICD-10-CM | POA: Insufficient documentation

## 2013-09-16 DIAGNOSIS — J45909 Unspecified asthma, uncomplicated: Secondary | ICD-10-CM | POA: Insufficient documentation

## 2013-09-16 DIAGNOSIS — L0390 Cellulitis, unspecified: Secondary | ICD-10-CM

## 2013-09-16 DIAGNOSIS — IMO0002 Reserved for concepts with insufficient information to code with codable children: Secondary | ICD-10-CM | POA: Insufficient documentation

## 2013-09-16 DIAGNOSIS — H269 Unspecified cataract: Secondary | ICD-10-CM | POA: Insufficient documentation

## 2013-09-16 DIAGNOSIS — Z853 Personal history of malignant neoplasm of breast: Secondary | ICD-10-CM | POA: Insufficient documentation

## 2013-09-16 DIAGNOSIS — Z8719 Personal history of other diseases of the digestive system: Secondary | ICD-10-CM | POA: Insufficient documentation

## 2013-09-16 DIAGNOSIS — F411 Generalized anxiety disorder: Secondary | ICD-10-CM | POA: Insufficient documentation

## 2013-09-16 DIAGNOSIS — Z8701 Personal history of pneumonia (recurrent): Secondary | ICD-10-CM | POA: Insufficient documentation

## 2013-09-16 DIAGNOSIS — Z87891 Personal history of nicotine dependence: Secondary | ICD-10-CM | POA: Insufficient documentation

## 2013-09-16 DIAGNOSIS — E785 Hyperlipidemia, unspecified: Secondary | ICD-10-CM | POA: Insufficient documentation

## 2013-09-16 DIAGNOSIS — E669 Obesity, unspecified: Secondary | ICD-10-CM | POA: Insufficient documentation

## 2013-09-16 MED ORDER — AMOXICILLIN-POT CLAVULANATE 875-125 MG PO TABS
1.0000 | ORAL_TABLET | Freq: Two times a day (BID) | ORAL | Status: AC
Start: 2013-09-16 — End: ?

## 2013-09-16 MED ORDER — DEXTROSE 5 % IV SOLN
1.0000 g | Freq: Once | INTRAVENOUS | Status: AC
  Administered 2013-09-16: 1 g via INTRAVENOUS

## 2013-09-16 MED ORDER — CEFTRIAXONE SODIUM 1 G IJ SOLR
INTRAMUSCULAR | Status: AC
  Filled 2013-09-16: qty 10

## 2013-09-16 MED ORDER — ACETAMINOPHEN-CODEINE #3 300-30 MG PO TABS: 1.0000 | ORAL_TABLET | Freq: Four times a day (QID) | ORAL | Status: AC | PRN

## 2013-09-16 NOTE — ED Provider Notes (Signed)
CSN: [id number]     Arrival date & time 09/16/13  1027 History   First MD Initiated Contact with Patient 09/16/13 1108     Chief Complaint  Patient presents with  . Wound Infection     (Consider location/radiation/quality/duration/timing/severity/associated sxs/prior Treatment) HPI Pt with history of breast cancer and mastectomy with subsequent lymphedema of RUE has had recurrent cellulitis in that arm. Last episode was about a year ago, treated with Augmentin by Infectious Disease. Pt report last night she began to have increased swelling and redness to RUE with fever to 103F at home. She has had increased use of that arm the last few days while preparing for moving. She is adamant that she does not want to be admitted. She attempted to get appointment with ID today but they were closed. No other symptoms concerning for source of fever such as cough, sore throat, N/V/D or dysuria.   Past Medical History  Diagnosis Date  . Anxiety   . Breast cancer   . Psoriasis   . Fibroids   . Asthma   . Barrett's esophagus   . Colon polyps   . Diverticulosis   . Fibromyalgia   . Hyperlipemia   . Grave's disease   . Obesity   . Pneumonia   . Cellulitis   . Cataracts, bilateral    Past Surgical History  Procedure Laterality Date  . Thyroidectomy    . Mastectomy    . Appendectomy    . Rotator cuff repair  2009    right arm   . Breast surgery      R mastecomty, L reduction   Family History  Problem Relation Age of Onset  . Diabetes Brother   . Heart disease Mother   . Asthma Mother   . Kidney disease Brother   . Pancreatic cancer Brother     metastasis to liver  . Stroke Father   . Heart failure Sister   . Liver cancer Brother   . Heart attack Sister   . Heart attack Mother    History  Substance Use Topics  . Smoking status: Former Smoker -- 25 years    Quit date: 12/29/1994  . Smokeless tobacco: Never Used  . Alcohol Use: No   OB History   Grav Para Term Preterm  Abortions TAB SAB Ect Mult Living                 Review of Systems All other systems reviewed and are negative except as noted in HPI.     Allergies  Review of patient's allergies indicates no known allergies.  Home Medications   Current Outpatient Rx  Name  Route  Sig  Dispense  Refill  . albuterol (PROVENTIL HFA;VENTOLIN HFA) 108 (90 BASE) MCG/ACT inhaler   Inhalation   Inhale 2 puffs into the lungs every 6 (six) hours as needed for wheezing or shortness of breath. For shortness of breath   1 Inhaler   3   . atorvastatin (LIPITOR) 20 MG tablet   Oral   Take 20 mg by mouth daily.         . furosemide (LASIX) 40 MG tablet   Oral   Take 40 mg by mouth daily.         Marland Kitchen levothyroxine (SYNTHROID, LEVOTHROID) 100 MCG tablet   Oral   Take 100 mcg by mouth daily.           Marland Kitchen omeprazole (PRILOSEC) 20 MG capsule   Oral  Take 1 capsule (20 mg total) by mouth 2 (two) times daily before a meal.   60 capsule   5   . zolpidem (AMBIEN) 10 MG tablet   Oral   Take 5 mg by mouth at bedtime as needed.         Marland Kitchen amoxicillin-clavulanate (AUGMENTIN) 875-125 MG per tablet   Oral   Take 1 tablet by mouth every 12 (twelve) hours.   10 tablet   0   . citalopram (CELEXA) 20 MG tablet   Oral   Take 20 mg by mouth daily.           . Diclofenac Sodium (PENNSAID) 2 % SOLN   Transdermal   Place 1 application onto the skin 2 (two) times daily.         . fluticasone (FLONASE) 50 MCG/ACT nasal spray   Each Nare   Place 1 spray into both nostrils daily.   16 g   1   . fluticasone (FLOVENT HFA) 220 MCG/ACT inhaler   Inhalation   Inhale 1 puff into the lungs daily.   1 Inhaler   1    BP 140/62  Temp(Src) 98.3 F (36.8 C) (Oral)  Resp 20  Ht 5' 0.75" (1.543 m)  Wt 195 lb (88.451 kg)  BMI 37.15 kg/m2  SpO2 99% Physical Exam  Nursing note and vitals reviewed. Constitutional: She is oriented to person, place, and time. She appears well-developed and  well-nourished.  HENT:  Head: Normocephalic and atraumatic.  Eyes: EOM are normal. Pupils are equal, round, and reactive to light.  Neck: Normal range of motion. Neck supple.  Cardiovascular: Normal rate, normal heart sounds and intact distal pulses.   Pulmonary/Chest: Effort normal and breath sounds normal.  Abdominal: Bowel sounds are normal. She exhibits no distension. There is no tenderness.  Musculoskeletal: Normal range of motion. She exhibits edema. She exhibits no tenderness.  RUE is erythematous, warm, no abscess. Edema is slightly increased from baseline per patient.  Neurological: She is alert and oriented to person, place, and time. She has normal strength. No cranial nerve deficit or sensory deficit.  Skin: Skin is warm and dry. No rash noted.  Psychiatric: She has a normal mood and affect.    ED Course  Procedures (including critical care time) Labs Review Labs Reviewed - No data to display Imaging Review No results found.   EKG Interpretation None      MDM   Final diagnoses:  RUE Cellulitis    Pt with normal vitals in the ED. No signs of sepsis. She wants to 'get a drip of antibiotics then go home with pills'. Advised to wear arm sleeve to help with swelling, followup with ID next week and return to the ED in     [place] B. Karle Starch, MD 09/16/13 1329

## 2013-09-16 NOTE — Discharge Instructions (Signed)
Cellulitis Cellulitis is an infection of the skin and the tissue beneath it. The infected area is usually red and tender. Cellulitis occurs most often in the arms and lower legs.  CAUSES  Cellulitis is caused by bacteria that enter the skin through cracks or cuts in the skin. The most common types of bacteria that cause cellulitis are Staphylococcus and Streptococcus. SYMPTOMS   Redness and warmth.  Swelling.  Tenderness or pain.  Fever. DIAGNOSIS  Your caregiver can usually determine what is wrong based on a physical exam. Blood tests may also be done. TREATMENT  Treatment usually involves taking an antibiotic medicine. HOME CARE INSTRUCTIONS   Take your antibiotics as directed. Finish them even if you start to feel better.  Keep the infected arm or leg elevated to reduce swelling.  Apply a warm cloth to the affected area up to 4 times per day to relieve pain.  Only take over-the-counter or prescription medicines for pain, discomfort, or fever as directed by your caregiver.  Keep all follow-up appointments as directed by your caregiver. SEEK MEDICAL CARE IF:   You notice red streaks coming from the infected area.  Your red area gets larger or turns dark in color.  Your bone or joint underneath the infected area becomes painful after the skin has healed.  Your infection returns in the same area or another area.  You notice a swollen bump in the infected area.  You develop new symptoms. SEEK IMMEDIATE MEDICAL CARE IF:   You have a fever.  You feel very sleepy.  You develop vomiting or diarrhea.  You have a general ill feeling (malaise) with muscle aches and pains. MAKE SURE YOU:   Understand these instructions.  Will watch your condition.  Will get help right away if you are not doing well or get worse. Document Released: 03/05/2005 Document Revised: 11/25/2011 Document Reviewed: 08/11/2011 [employer] Patient Information 2014 [employer].  

## 2013-09-16 NOTE — ED Notes (Signed)
Patient states she has a history of cellulitis of her right arm.  States last night at approximately 1930 pm she developed chills and fever of unknown amount.  Took two 325 mg aspirin.  Temp was up to 103 this morning at 0130 am and developed soreness and redness developed in the right arm .

## 2013-10-07 ENCOUNTER — Encounter: Payer: Self-pay | Admitting: Gastroenterology

## 2014-12-04 ENCOUNTER — Other Ambulatory Visit: Payer: Self-pay

## 2015-02-21 ENCOUNTER — Other Ambulatory Visit: Payer: Self-pay | Admitting: Obstetrics and Gynecology

## 2015-02-21 DIAGNOSIS — N63 Unspecified lump in unspecified breast: Secondary | ICD-10-CM

## 2015-02-28 ENCOUNTER — Other Ambulatory Visit: Payer: No Typology Code available for payment source

## 2015-03-01 ENCOUNTER — Ambulatory Visit
Admission: RE | Admit: 2015-03-01 | Discharge: 2015-03-01 | Disposition: A | Discharge status: Home / Self Care | Payer: Medicare Other | Source: Ambulatory Visit | Attending: Obstetrics and Gynecology | Admitting: Obstetrics and Gynecology

## 2015-03-01 ENCOUNTER — Other Ambulatory Visit: Payer: Self-pay | Admitting: Obstetrics and Gynecology

## 2015-03-01 DIAGNOSIS — N63 Unspecified lump in unspecified breast: Secondary | ICD-10-CM

## 2015-11-19 ENCOUNTER — Encounter: Payer: Self-pay | Admitting: Gastroenterology

## 2017-04-09 IMAGING — MG MM DIAG BREAST TOMO UNI LEFT
5 series · 6 of 13 positions shown · non-contrast
Comparison: Previous exam(s).

ACR Breast Density Category a: The breast tissue is almost entirely
fatty.

CLINICAL DATA: Palpable area of concern in the far lower outer
quadrant of the left breast near the edge of the breast in the left
chest. Also, the patient reports focal areas of tenderness along her
mastectomy scar on the right.

EXAM:
DIGITAL DIAGNOSTIC LEFT MAMMOGRAM WITH 3D TOMOSYNTHESIS WITH CAD
ULTRASOUND BILATERAL BREAST

[L TAN]
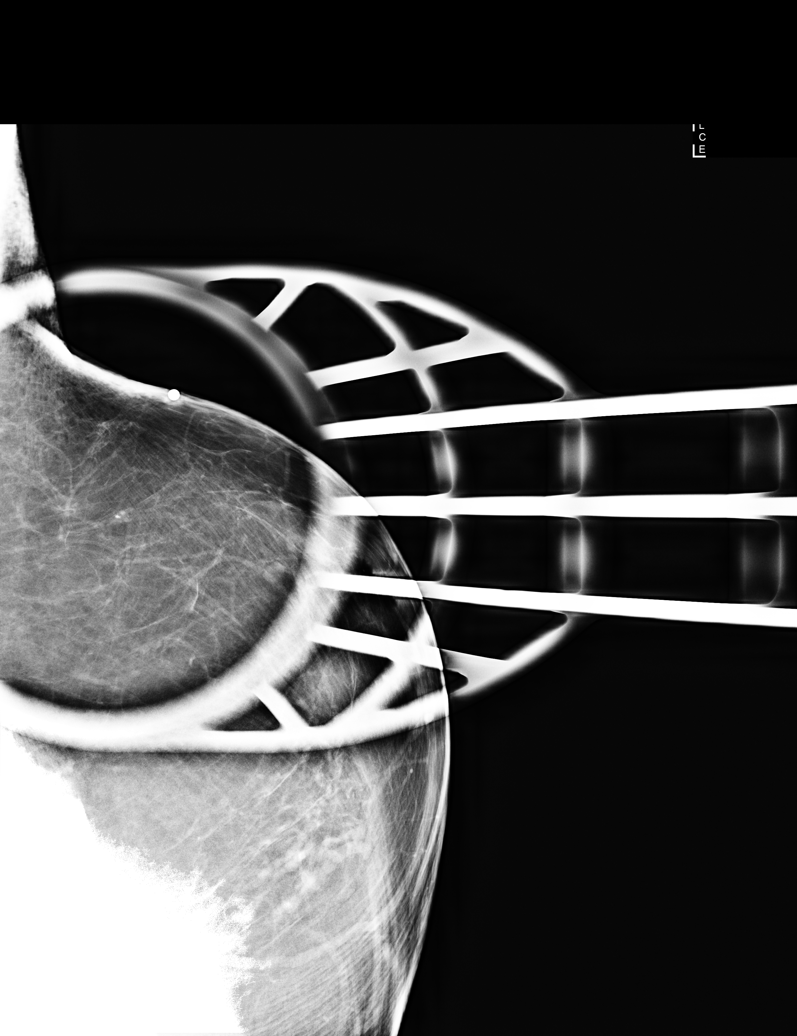

[L CC]
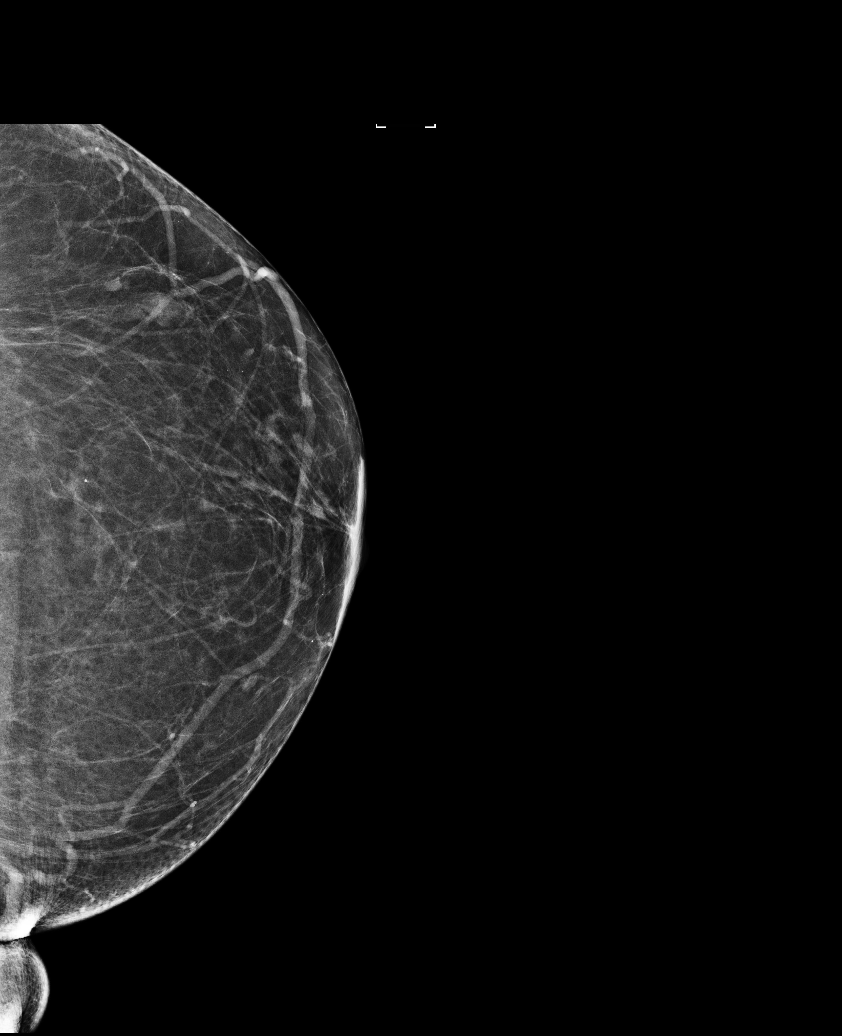

[L MLO]
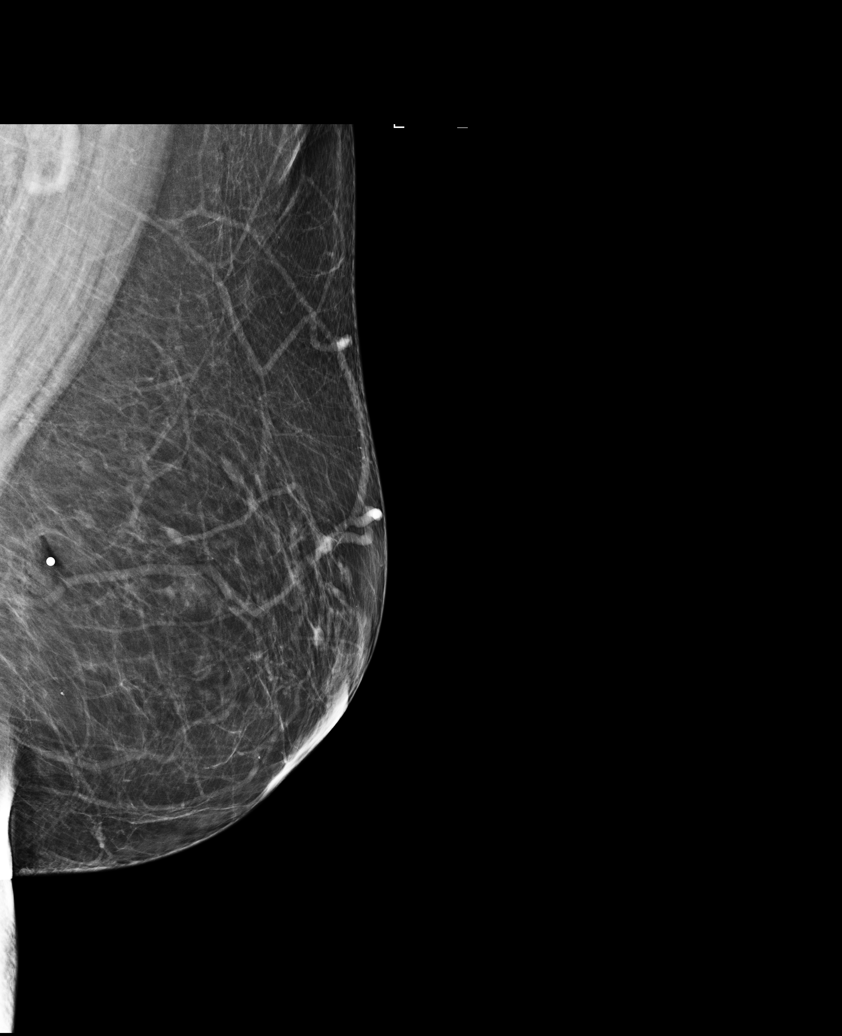

[L MLO tomo · 2 of 76 frames shown]
[frame 25/76]
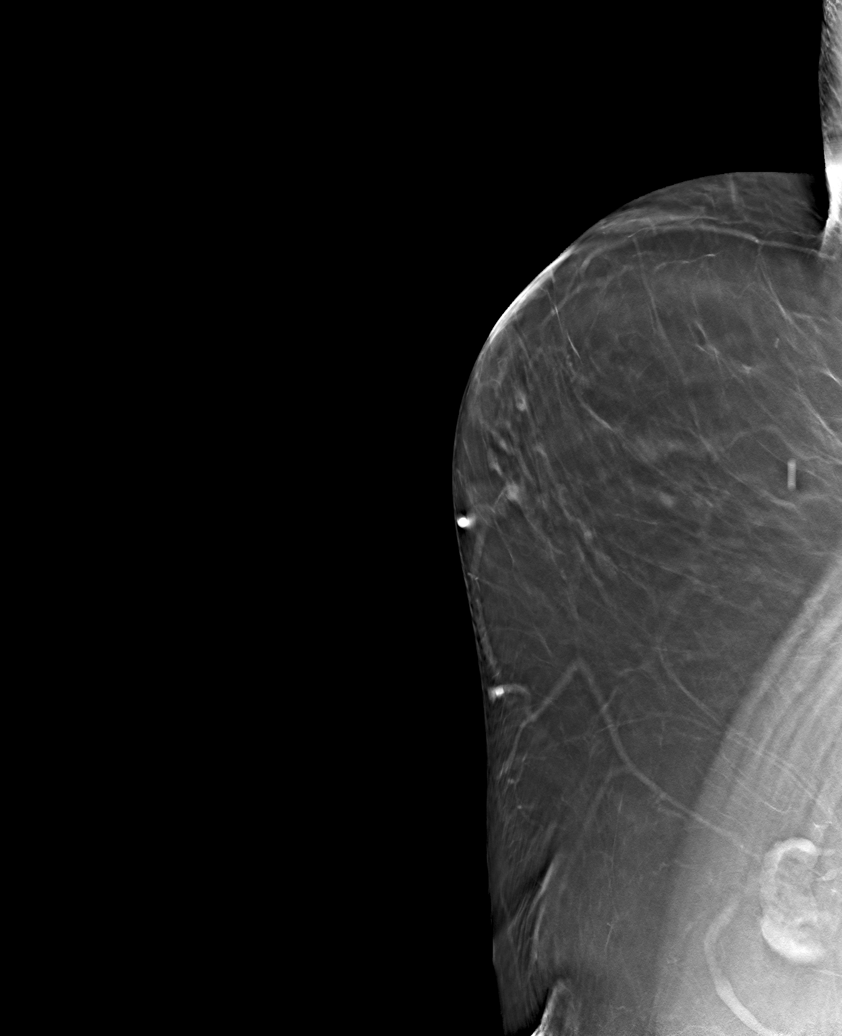
[frame 39/76]
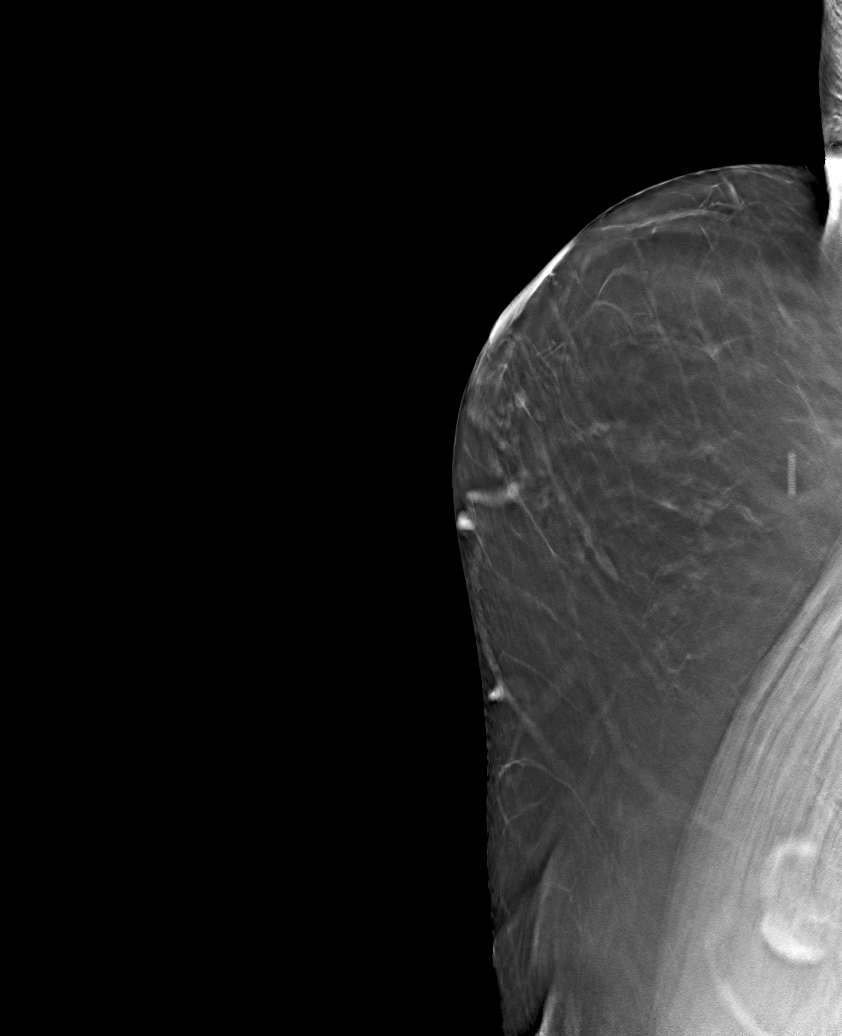

[L CC tomo · tomo slice 33/64.0]
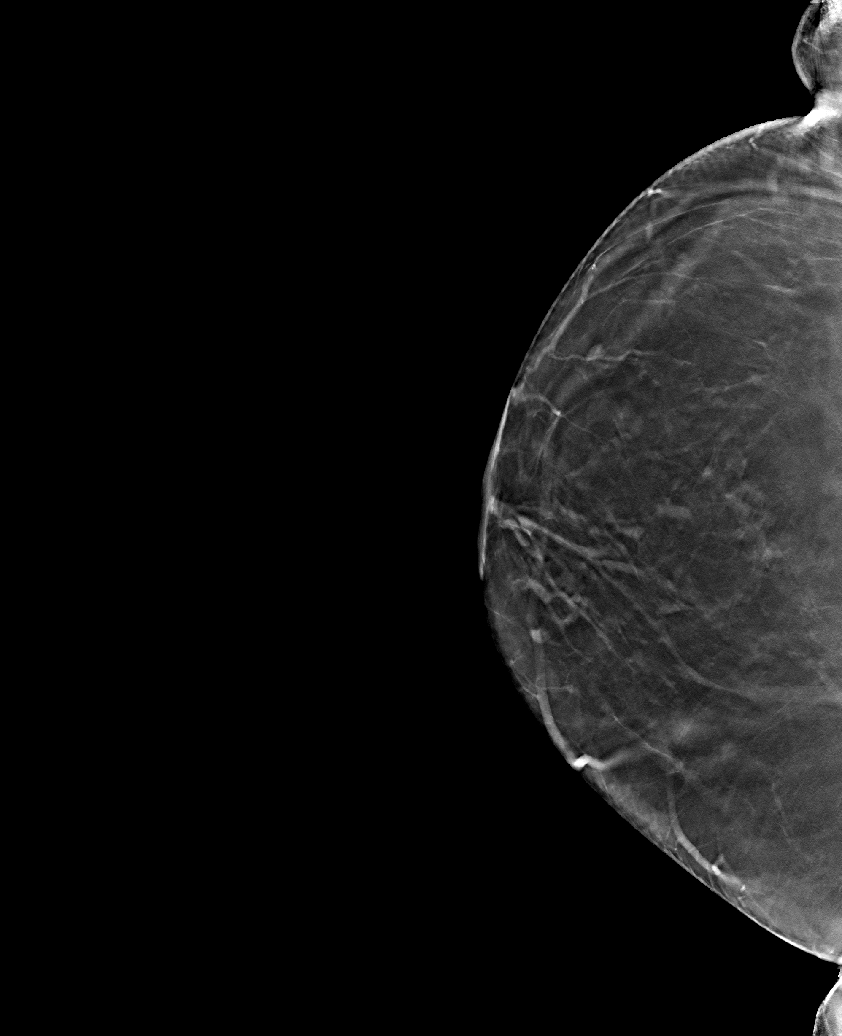

[6 of 13 positions shown; findings below may reference images not displayed]

FINDINGS: Metallic skin marker was placed over the lower outer left breast in
the region of patient concern. Breast tissue is fatty in this
region. No mass or distortion is seen. There are postsurgical
changes of reduction mammoplasty. No suspicious mass, nonsurgical
distortion, or suspicious microcalcification is identified in the
left breast.

Mammographic images were processed with CAD.

On physical exam, I palpate a soft fat lobule in the far lower outer
quadrant of the left breast.

On the right, there is a healed mastectomy scar. I do not palpate a
discrete mass or nodularity in the mastectomy.

Targeted ultrasound is performed, showing normal fatty breast
parenchyma in the region of patient concern in the lower outer
quadrant of the left breast.

A benign appearing scar tissue is seen in the right mastectomy. No
sonographic findings to suggest malignancy on the right.
IMPRESSION: No evidence of malignancy in the left breast.

No evidence of malignancy in the right mastectomy.

RECOMMENDATION:
Screening mammogram in one year.(Code:M6-2-RC5)

I have discussed the findings and recommendations with the patient.
Results were also provided in writing at the conclusion of the
visit. If applicable, a reminder letter will be sent to the patient
regarding the next appointment.

BI-RADS CATEGORY  1: Negative.
